# Patient Record
Sex: Female | Born: 1955 | Race: White | Hispanic: No | Marital: Married | Smoking: Never smoker
Health system: Southern US, Community
[De-identification: ages and names within clinical notes are randomized; demographics above are authoritative.]

## PROBLEM LIST (undated history)

## (undated) DIAGNOSIS — E538 Deficiency of other specified B group vitamins: Secondary | ICD-10-CM

## (undated) DIAGNOSIS — K8020 Calculus of gallbladder without cholecystitis without obstruction: Secondary | ICD-10-CM

## (undated) DIAGNOSIS — I10 Essential (primary) hypertension: Secondary | ICD-10-CM

## (undated) DIAGNOSIS — B029 Zoster without complications: Secondary | ICD-10-CM

## (undated) DIAGNOSIS — Z8489 Family history of other specified conditions: Secondary | ICD-10-CM

## (undated) DIAGNOSIS — N6019 Diffuse cystic mastopathy of unspecified breast: Secondary | ICD-10-CM

## (undated) DIAGNOSIS — G4730 Sleep apnea, unspecified: Secondary | ICD-10-CM

## (undated) DIAGNOSIS — E7800 Pure hypercholesterolemia, unspecified: Secondary | ICD-10-CM

## (undated) DIAGNOSIS — E0590 Thyrotoxicosis, unspecified without thyrotoxic crisis or storm: Secondary | ICD-10-CM

## (undated) DIAGNOSIS — K269 Duodenal ulcer, unspecified as acute or chronic, without hemorrhage or perforation: Secondary | ICD-10-CM

## (undated) DIAGNOSIS — C4490 Unspecified malignant neoplasm of skin, unspecified: Secondary | ICD-10-CM

## (undated) DIAGNOSIS — M5030 Other cervical disc degeneration, unspecified cervical region: Secondary | ICD-10-CM

## (undated) DIAGNOSIS — E119 Type 2 diabetes mellitus without complications: Secondary | ICD-10-CM

## (undated) DIAGNOSIS — R112 Nausea with vomiting, unspecified: Secondary | ICD-10-CM

## (undated) DIAGNOSIS — E559 Vitamin D deficiency, unspecified: Secondary | ICD-10-CM

## (undated) DIAGNOSIS — Z9889 Other specified postprocedural states: Secondary | ICD-10-CM

## (undated) DIAGNOSIS — D649 Anemia, unspecified: Secondary | ICD-10-CM

## (undated) DIAGNOSIS — K219 Gastro-esophageal reflux disease without esophagitis: Secondary | ICD-10-CM

## (undated) HISTORY — DX: Unspecified malignant neoplasm of skin, unspecified: C44.90

## (undated) HISTORY — PX: MANDIBLE SURGERY: SHX707

## (undated) HISTORY — PX: ECTOPIC PREGNANCY SURGERY: SHX613

## (undated) HISTORY — PX: ABDOMINAL HYSTERECTOMY: SHX81

## (undated) HISTORY — PX: HAND SURGERY: SHX662

## (undated) HISTORY — PX: WISDOM TOOTH EXTRACTION: SHX21

---

## 1990-09-12 HISTORY — PX: BREAST EXCISIONAL BIOPSY: SUR124

## 2005-02-15 ENCOUNTER — Ambulatory Visit: Payer: Self-pay | Admitting: Unknown Physician Specialty

## 2005-03-12 ENCOUNTER — Ambulatory Visit: Payer: Self-pay | Admitting: Unknown Physician Specialty

## 2005-07-15 ENCOUNTER — Ambulatory Visit: Payer: Self-pay | Admitting: General Surgery

## 2005-07-26 ENCOUNTER — Ambulatory Visit: Payer: Self-pay

## 2005-08-12 ENCOUNTER — Ambulatory Visit: Payer: Self-pay

## 2006-07-26 ENCOUNTER — Ambulatory Visit: Payer: Self-pay | Admitting: General Surgery

## 2006-08-28 ENCOUNTER — Ambulatory Visit: Payer: Self-pay | Admitting: Internal Medicine

## 2006-09-12 ENCOUNTER — Ambulatory Visit: Payer: Self-pay | Admitting: Internal Medicine

## 2006-09-20 ENCOUNTER — Ambulatory Visit: Payer: Self-pay | Admitting: Unknown Physician Specialty

## 2006-09-27 ENCOUNTER — Ambulatory Visit: Payer: Self-pay | Admitting: Internal Medicine

## 2006-10-13 ENCOUNTER — Ambulatory Visit: Payer: Self-pay | Admitting: Internal Medicine

## 2007-10-04 ENCOUNTER — Ambulatory Visit: Payer: Self-pay | Admitting: General Surgery

## 2008-01-21 ENCOUNTER — Ambulatory Visit: Payer: Self-pay | Admitting: Internal Medicine

## 2008-02-11 ENCOUNTER — Ambulatory Visit: Payer: Self-pay | Admitting: Internal Medicine

## 2008-09-10 ENCOUNTER — Ambulatory Visit: Payer: Self-pay | Admitting: Unknown Physician Specialty

## 2008-10-06 ENCOUNTER — Ambulatory Visit: Payer: Self-pay | Admitting: Unknown Physician Specialty

## 2008-11-22 ENCOUNTER — Ambulatory Visit: Payer: Self-pay | Admitting: Unknown Physician Specialty

## 2010-02-02 ENCOUNTER — Ambulatory Visit: Payer: Self-pay | Admitting: Unknown Physician Specialty

## 2011-02-16 ENCOUNTER — Ambulatory Visit: Payer: Self-pay | Admitting: Unknown Physician Specialty

## 2011-03-29 ENCOUNTER — Ambulatory Visit: Payer: Self-pay | Admitting: Unknown Physician Specialty

## 2011-05-04 ENCOUNTER — Ambulatory Visit: Payer: Self-pay | Admitting: Unknown Physician Specialty

## 2011-05-05 LAB — PATHOLOGY REPORT

## 2011-07-13 DIAGNOSIS — IMO0002 Reserved for concepts with insufficient information to code with codable children: Secondary | ICD-10-CM | POA: Insufficient documentation

## 2011-07-13 DIAGNOSIS — E1065 Type 1 diabetes mellitus with hyperglycemia: Secondary | ICD-10-CM | POA: Insufficient documentation

## 2011-07-13 DIAGNOSIS — K219 Gastro-esophageal reflux disease without esophagitis: Secondary | ICD-10-CM | POA: Insufficient documentation

## 2011-07-13 DIAGNOSIS — E7800 Pure hypercholesterolemia, unspecified: Secondary | ICD-10-CM | POA: Insufficient documentation

## 2011-12-22 DIAGNOSIS — Z79899 Other long term (current) drug therapy: Secondary | ICD-10-CM | POA: Insufficient documentation

## 2011-12-22 DIAGNOSIS — Z8639 Personal history of other endocrine, nutritional and metabolic disease: Secondary | ICD-10-CM | POA: Insufficient documentation

## 2012-04-23 DIAGNOSIS — K8020 Calculus of gallbladder without cholecystitis without obstruction: Secondary | ICD-10-CM | POA: Insufficient documentation

## 2012-06-28 DIAGNOSIS — M549 Dorsalgia, unspecified: Secondary | ICD-10-CM | POA: Insufficient documentation

## 2012-08-07 ENCOUNTER — Ambulatory Visit: Payer: Self-pay | Admitting: Unknown Physician Specialty

## 2012-12-06 DIAGNOSIS — G4730 Sleep apnea, unspecified: Secondary | ICD-10-CM | POA: Insufficient documentation

## 2013-02-01 DIAGNOSIS — R0683 Snoring: Secondary | ICD-10-CM | POA: Insufficient documentation

## 2013-02-01 DIAGNOSIS — R519 Headache, unspecified: Secondary | ICD-10-CM | POA: Insufficient documentation

## 2013-08-13 ENCOUNTER — Ambulatory Visit: Payer: Self-pay | Admitting: Unknown Physician Specialty

## 2013-08-23 ENCOUNTER — Ambulatory Visit: Payer: Self-pay | Admitting: Unknown Physician Specialty

## 2013-11-29 ENCOUNTER — Ambulatory Visit: Payer: Self-pay | Admitting: Unknown Physician Specialty

## 2014-11-17 DIAGNOSIS — I10 Essential (primary) hypertension: Secondary | ICD-10-CM | POA: Insufficient documentation

## 2015-04-28 ENCOUNTER — Other Ambulatory Visit: Payer: Self-pay | Admitting: Unknown Physician Specialty

## 2015-04-28 DIAGNOSIS — Z1231 Encounter for screening mammogram for malignant neoplasm of breast: Secondary | ICD-10-CM

## 2015-05-13 ENCOUNTER — Ambulatory Visit: Payer: Self-pay

## 2015-06-02 ENCOUNTER — Ambulatory Visit
Admission: RE | Admit: 2015-06-02 | Discharge: 2015-06-02 | Disposition: A | Discharge status: Home / Self Care | Payer: BLUE CROSS/BLUE SHIELD | Source: Ambulatory Visit | Attending: Unknown Physician Specialty | Admitting: Unknown Physician Specialty

## 2015-06-02 DIAGNOSIS — Z1231 Encounter for screening mammogram for malignant neoplasm of breast: Secondary | ICD-10-CM | POA: Insufficient documentation

## 2015-11-18 DIAGNOSIS — Z0000 Encounter for general adult medical examination without abnormal findings: Secondary | ICD-10-CM | POA: Insufficient documentation

## 2016-06-07 ENCOUNTER — Other Ambulatory Visit: Payer: Self-pay | Admitting: Obstetrics & Gynecology

## 2016-06-07 DIAGNOSIS — Z1231 Encounter for screening mammogram for malignant neoplasm of breast: Secondary | ICD-10-CM

## 2016-06-09 ENCOUNTER — Other Ambulatory Visit: Payer: Self-pay | Admitting: Obstetrics & Gynecology

## 2016-06-09 DIAGNOSIS — N644 Mastodynia: Secondary | ICD-10-CM

## 2016-06-09 DIAGNOSIS — R928 Other abnormal and inconclusive findings on diagnostic imaging of breast: Secondary | ICD-10-CM

## 2016-06-24 ENCOUNTER — Ambulatory Visit
Admission: RE | Admit: 2016-06-24 | Discharge: 2016-06-24 | Disposition: A | Discharge status: Home / Self Care | Payer: BLUE CROSS/BLUE SHIELD | Source: Ambulatory Visit | Attending: Obstetrics & Gynecology | Admitting: Obstetrics & Gynecology

## 2016-06-24 ENCOUNTER — Ambulatory Visit
Admission: RE | Admit: 2016-06-24 | Discharge: 2016-06-24 | Disposition: A | Discharge status: Home / Self Care | Source: Ambulatory Visit | Attending: Obstetrics & Gynecology | Admitting: Obstetrics & Gynecology

## 2016-06-24 DIAGNOSIS — R928 Other abnormal and inconclusive findings on diagnostic imaging of breast: Secondary | ICD-10-CM

## 2016-06-24 DIAGNOSIS — N644 Mastodynia: Secondary | ICD-10-CM

## 2016-07-05 ENCOUNTER — Encounter: Payer: Self-pay | Admitting: *Deleted

## 2016-07-06 ENCOUNTER — Encounter
Admission: RE | Disposition: A | Discharge status: Home / Self Care | Payer: Self-pay | Source: Ambulatory Visit | Attending: Unknown Physician Specialty

## 2016-07-06 ENCOUNTER — Ambulatory Visit
Admission: RE | Admit: 2016-07-06 | Discharge: 2016-07-06 | Disposition: A | Discharge status: Home / Self Care | Payer: BLUE CROSS/BLUE SHIELD | Source: Ambulatory Visit | Attending: Unknown Physician Specialty | Admitting: Unknown Physician Specialty

## 2016-07-06 ENCOUNTER — Ambulatory Visit: Payer: BLUE CROSS/BLUE SHIELD | Admitting: Anesthesiology

## 2016-07-06 ENCOUNTER — Encounter: Payer: Self-pay | Admitting: *Deleted

## 2016-07-06 DIAGNOSIS — Z794 Long term (current) use of insulin: Secondary | ICD-10-CM | POA: Diagnosis not present

## 2016-07-06 DIAGNOSIS — K5730 Diverticulosis of large intestine without perforation or abscess without bleeding: Secondary | ICD-10-CM | POA: Diagnosis not present

## 2016-07-06 DIAGNOSIS — E7800 Pure hypercholesterolemia, unspecified: Secondary | ICD-10-CM | POA: Insufficient documentation

## 2016-07-06 DIAGNOSIS — G4730 Sleep apnea, unspecified: Secondary | ICD-10-CM | POA: Diagnosis not present

## 2016-07-06 DIAGNOSIS — E109 Type 1 diabetes mellitus without complications: Secondary | ICD-10-CM | POA: Insufficient documentation

## 2016-07-06 DIAGNOSIS — Z1211 Encounter for screening for malignant neoplasm of colon: Secondary | ICD-10-CM | POA: Diagnosis present

## 2016-07-06 DIAGNOSIS — E559 Vitamin D deficiency, unspecified: Secondary | ICD-10-CM | POA: Diagnosis not present

## 2016-07-06 DIAGNOSIS — I10 Essential (primary) hypertension: Secondary | ICD-10-CM | POA: Insufficient documentation

## 2016-07-06 DIAGNOSIS — Z86010 Personal history of colonic polyps: Secondary | ICD-10-CM | POA: Insufficient documentation

## 2016-07-06 DIAGNOSIS — D649 Anemia, unspecified: Secondary | ICD-10-CM | POA: Diagnosis not present

## 2016-07-06 DIAGNOSIS — E538 Deficiency of other specified B group vitamins: Secondary | ICD-10-CM | POA: Diagnosis not present

## 2016-07-06 DIAGNOSIS — E0590 Thyrotoxicosis, unspecified without thyrotoxic crisis or storm: Secondary | ICD-10-CM | POA: Diagnosis not present

## 2016-07-06 DIAGNOSIS — K640 First degree hemorrhoids: Secondary | ICD-10-CM | POA: Diagnosis not present

## 2016-07-06 DIAGNOSIS — K219 Gastro-esophageal reflux disease without esophagitis: Secondary | ICD-10-CM | POA: Insufficient documentation

## 2016-07-06 DIAGNOSIS — E669 Obesity, unspecified: Secondary | ICD-10-CM | POA: Diagnosis not present

## 2016-07-06 DIAGNOSIS — Z79899 Other long term (current) drug therapy: Secondary | ICD-10-CM | POA: Diagnosis not present

## 2016-07-06 DIAGNOSIS — Z6832 Body mass index (BMI) 32.0-32.9, adult: Secondary | ICD-10-CM | POA: Diagnosis not present

## 2016-07-06 DIAGNOSIS — K279 Peptic ulcer, site unspecified, unspecified as acute or chronic, without hemorrhage or perforation: Secondary | ICD-10-CM | POA: Diagnosis not present

## 2016-07-06 DIAGNOSIS — Z7982 Long term (current) use of aspirin: Secondary | ICD-10-CM | POA: Insufficient documentation

## 2016-07-06 HISTORY — DX: Pure hypercholesterolemia, unspecified: E78.00

## 2016-07-06 HISTORY — DX: Duodenal ulcer, unspecified as acute or chronic, without hemorrhage or perforation: K26.9

## 2016-07-06 HISTORY — DX: Thyrotoxicosis, unspecified without thyrotoxic crisis or storm: E05.90

## 2016-07-06 HISTORY — PX: COLONOSCOPY WITH PROPOFOL: SHX5780

## 2016-07-06 HISTORY — DX: Essential (primary) hypertension: I10

## 2016-07-06 HISTORY — DX: Diffuse cystic mastopathy of unspecified breast: N60.19

## 2016-07-06 HISTORY — DX: Type 2 diabetes mellitus without complications: E11.9

## 2016-07-06 HISTORY — DX: Sleep apnea, unspecified: G47.30

## 2016-07-06 HISTORY — DX: Calculus of gallbladder without cholecystitis without obstruction: K80.20

## 2016-07-06 HISTORY — DX: Vitamin D deficiency, unspecified: E55.9

## 2016-07-06 HISTORY — DX: Zoster without complications: B02.9

## 2016-07-06 HISTORY — DX: Other cervical disc degeneration, unspecified cervical region: M50.30

## 2016-07-06 HISTORY — DX: Deficiency of other specified B group vitamins: E53.8

## 2016-07-06 HISTORY — DX: Gastro-esophageal reflux disease without esophagitis: K21.9

## 2016-07-06 HISTORY — DX: Anemia, unspecified: D64.9

## 2016-07-06 LAB — GLUCOSE, CAPILLARY: Glucose-Capillary: 241 mg/dL — ABNORMAL HIGH (ref 65–99)

## 2016-07-06 SURGERY — COLONOSCOPY WITH PROPOFOL
Anesthesia: General

## 2016-07-06 MED ORDER — LIDOCAINE 2% (20 MG/ML) 5 ML SYRINGE
INTRAMUSCULAR | Status: DC | PRN
  Administered 2016-07-06: 30 mg via INTRAVENOUS

## 2016-07-06 MED ORDER — FENTANYL CITRATE (PF) 100 MCG/2ML IJ SOLN
INTRAMUSCULAR | Status: DC | PRN
  Administered 2016-07-06: 50 ug via INTRAVENOUS

## 2016-07-06 MED ORDER — SODIUM CHLORIDE 0.9 % IV SOLN
INTRAVENOUS | Status: DC
  Administered 2016-07-06: 1000 mL via INTRAVENOUS

## 2016-07-06 MED ORDER — PHENYLEPHRINE HCL 10 MG/ML IJ SOLN
INTRAMUSCULAR | Status: DC | PRN
  Administered 2016-07-06 (×2): 100 ug via INTRAVENOUS

## 2016-07-06 MED ORDER — PROPOFOL 500 MG/50ML IV EMUL
INTRAVENOUS | Status: DC | PRN
  Administered 2016-07-06: 150 ug/kg/min via INTRAVENOUS

## 2016-07-06 MED ORDER — SODIUM CHLORIDE 0.9 % IV SOLN: INTRAVENOUS | Status: DC

## 2016-07-06 MED ORDER — MIDAZOLAM HCL 5 MG/5ML IJ SOLN
INTRAMUSCULAR | Status: DC | PRN
  Administered 2016-07-06: 1 mg via INTRAVENOUS

## 2016-07-06 MED ORDER — PROPOFOL 10 MG/ML IV BOLUS
INTRAVENOUS | Status: DC | PRN
  Administered 2016-07-06: 80 mg via INTRAVENOUS

## 2016-07-06 NOTE — Anesthesia Postprocedure Evaluation (Signed)
Anesthesia Post Note  Patient: Terry Hickman  Procedure(s) Performed: Procedure(s) (LRB): COLONOSCOPY WITH PROPOFOL (N/A)  Patient location during evaluation: PACU Anesthesia Type: General Level of consciousness: awake Pain management: pain level controlled Vital Signs Assessment: post-procedure vital signs reviewed and stable Respiratory status: nonlabored ventilation Cardiovascular status: stable Anesthetic complications: no    Last Vitals:  Vitals:   07/06/16 0802 07/06/16 0812  BP: (!) 89/48 93/65  Pulse: 70 66  Resp: 19 20  Temp: 36.2 C     Last Pain:  Vitals:   07/06/16 0802  TempSrc: Tympanic                 VAN STAVEREN,Cohan Stipes

## 2016-07-06 NOTE — Transfer of Care (Signed)
Immediate Anesthesia Transfer of Care Note  Patient: Terry Hickman  Procedure(s) Performed: Procedure(s): COLONOSCOPY WITH PROPOFOL (N/A)  Patient Location: PACU and Endoscopy Unit  Anesthesia Type:General  Level of Consciousness: sedated  Airway & Oxygen Therapy: Patient Spontanous Breathing and Patient connected to nasal cannula oxygen  Post-op Assessment: Report given to RN and Post -op Vital signs reviewed and stable  Post vital signs: Reviewed and stable  Last Vitals:  Vitals:   07/06/16 0658 07/06/16 0700  BP: 124/78 (!) 89/48  Pulse: 74 70  Resp: 20 20  Temp: 36.6 C 36.2 C    Last Pain:  Vitals:   07/06/16 0700  TempSrc: Tympanic         Complications: No apparent anesthesia complications

## 2016-07-06 NOTE — H&P (Signed)
Primary Care Physician:  Kirk Ruths., MD Primary Gastroenterologist:  Dr. Vira Agar  Pre-Procedure History & Physical: HPI:  Terry Hickman is a 60 y.o. female is here for an colonoscopy.   Past Medical History:  Diagnosis Date  . Anemia   . DDD (degenerative disc disease), cervical   . Diabetes mellitus without complication ([place])   . Duodenal ulcer   . Fibrocystic breast disease   . Gall stone   . GERD (gastroesophageal reflux disease)   . Herpes zoster   . Hypercholesterolemia   . Hypertension   . Hyperthyroidism   . Shingles   . Sleep apnea   . Vitamin B 12 deficiency   . Vitamin D deficiency     Past Surgical History:  Procedure Laterality Date  . BREAST BIOPSY Right 09/12/1990   negative  . ECTOPIC PREGNANCY SURGERY    . MANDIBLE SURGERY      Prior to Admission medications   Medication Sig Start Date End Date Taking? Authorizing Provider  ascorbic acid (VITAMIN C) 1000 MG tablet Take 1,000 mg by mouth daily.   Yes Historical Provider, MD  aspirin EC 81 MG tablet Take 81 mg by mouth daily.   Yes Historical Provider, MD  atorvastatin (LIPITOR) 40 MG tablet Take 40 mg by mouth daily.   Yes Historical Provider, MD  cyanocobalamin 1000 MCG tablet Take 1,000 mcg by mouth daily.   Yes Historical Provider, MD  ergocalciferol (VITAMIN D2) 50000 units capsule Take 50,000 Units by mouth once a week.   Yes Historical Provider, MD  hydrochlorothiazide (HYDRODIURIL) 25 MG tablet Take 25 mg by mouth daily.   Yes Historical Provider, MD  insulin aspart (NOVOLOG FLEXPEN) 100 UNIT/ML FlexPen Inject 12 Units into the skin.   Yes Historical Provider, MD  insulin glargine (LANTUS) 100 UNIT/ML injection Inject 100 Units into the skin at bedtime.   Yes Historical Provider, MD  Melatonin 3 MG TABS Take 3 mg by mouth at bedtime.   Yes Historical Provider, MD  omeprazole (PRILOSEC) 20 MG capsule Take 20 mg by mouth 2 (two) times daily before a meal.   Yes Historical Provider, MD   ondansetron (ZOFRAN-ODT) 4 MG disintegrating tablet Take 4 mg by mouth every 8 (eight) hours as needed for nausea or vomiting.   Yes Historical Provider, MD  potassium chloride (MICRO-K) 10 MEQ CR capsule Take 10 mEq by mouth daily.   Yes Historical Provider, MD  valsartan (DIOVAN) 160 MG tablet Take 160 mg by mouth daily.   Yes Historical Provider, MD    Allergies as of 06/21/2016  . (Not on File)    Family History  Problem Relation Age of Onset  . Bladder Cancer Father   . Prostate cancer Father   . Bone cancer Maternal Aunt 80    Social History   Social History  . Marital status: Married    Spouse name: N/A  . Number of children: N/A  . Years of education: N/A   Occupational History  . Not on file.   Social History Main Topics  . Smoking status: Never Smoker  . Smokeless tobacco: Never Used  . Alcohol use No  . Drug use: No  . Sexual activity: Not on file   Other Topics Concern  . Not on file   Social History Narrative  . No narrative on file    Review of Systems: See HPI, otherwise negative ROS  Physical Exam: BP 124/78   Pulse 74   Temp 97.9 F (36.6  C) (Tympanic)   Resp 20   Ht 5\' 8"  (1.727 m)   Wt 96.6 kg (213 lb)   SpO2 99%   BMI 32.39 kg/m  General:   Alert,  pleasant and cooperative in NAD Head:  Normocephalic and atraumatic. Neck:  Supple; no masses or thyromegaly. Lungs:  Clear throughout to auscultation.    Heart:  Regular rate and rhythm. Abdomen:  Soft, nontender and nondistended. Normal bowel sounds, without guarding, and without rebound.   Neurologic:  Alert and  oriented x4;  grossly normal neurologically.  Impression/Plan: Terry Hickman is here for an colonoscopy to be performed for [facility] colon polyps  Risks, benefits, limitations, and alternatives regarding  colonoscopy have been reviewed with the patient.  Questions have been answered.  All parties agreeable.   Gaylyn Cheers, MD  07/06/2016, 7:31 AM

## 2016-07-06 NOTE — Anesthesia Preprocedure Evaluation (Addendum)
Anesthesia Evaluation  Patient identified by MRN, date of birth, ID band Patient awake    Reviewed: Allergy & Precautions, NPO status , Patient's Chart, lab work & pertinent test results  Airway Mallampati: II       Dental  (+) Teeth Intact, Caps   Pulmonary sleep apnea and Continuous Positive Airway Pressure Ventilation ,    breath sounds clear to auscultation       Cardiovascular hypertension, Pt. on medications  Rhythm:Regular     Neuro/Psych    GI/Hepatic PUD, GERD  Medicated,  Endo/Other  diabetes, Type 1, Insulin DependentHyperthyroidism   Renal/GU      Musculoskeletal   Abdominal (+) + obese,   Peds  Hematology  (+) anemia ,   Anesthesia Other Findings   Reproductive/Obstetrics                            Anesthesia Physical Anesthesia Plan  ASA: II  Anesthesia Plan: General   Post-op Pain Management:    Induction: Intravenous  Airway Management Planned: Natural Airway and Nasal Cannula  Additional Equipment:   Intra-op Plan:   Post-operative Plan:   Informed Consent: I have reviewed the patients History and Physical, chart, labs and discussed the procedure including the risks, benefits and alternatives for the proposed anesthesia with the patient or authorized representative who has indicated his/her understanding and acceptance.     Plan Discussed with: CRNA  Anesthesia Plan Comments:         Anesthesia Quick Evaluation

## 2016-07-06 NOTE — Op Note (Signed)
[facility] Gastroenterology Patient Name: Terry Hickman Procedure Date: 07/06/2016 7:33 AM MRN: [id number] Account #: [id number] Date of Birth: 03-Aug-1956 Admit Type: Outpatient Age: 60 Room: [facility] ENDO ROOM 4 Gender: Female Note Status: Finalized Procedure:            Colonoscopy Indications:          Screening for colorectal malignant neoplasm Providers:            Manya Silvas, MD Referring MD:         Ocie Cornfield. Ouida Sills MD, MD (Referring MD) Medicines:            Propofol per Anesthesia Complications:        No immediate complications. Procedure:            Pre-Anesthesia Assessment:                       - After reviewing the risks and benefits, the patient                        was deemed in satisfactory condition to undergo the                        procedure.                       After obtaining informed consent, the colonoscope was                        passed under direct vision. Throughout the procedure,                        the patient's blood pressure, pulse, and oxygen                        saturations were monitored continuously. The                        Colonoscope was introduced through the anus and                        advanced to the the cecum, identified by appendiceal                        orifice and ileocecal valve. The colonoscopy was                        performed without difficulty. The patient tolerated the                        procedure well. The quality of the bowel preparation                        was excellent. Findings:      A few small-mouthed diverticula were found in the sigmoid colon,       descending colon and transverse colon.      Internal hemorrhoids were found during endoscopy. The hemorrhoids were       small and Grade I (internal hemorrhoids that do not prolapse).      The exam was otherwise without abnormality. Impression:           - Diverticulosis  in the sigmoid colon, in the           descending colon and in the transverse colon.                       - Internal hemorrhoids.                       - The examination was otherwise normal.                       - No specimens collected. Recommendation:       - Repeat colonoscopy in 5 years for surveillance. Manya Silvas, MD 07/06/2016 7:55:57 AM This report has been signed electronically. Number of Addenda: 0 Note Initiated On: 07/06/2016 7:33 AM Scope Withdrawal Time: 0 hours 8 minutes 58 seconds  Total Procedure Duration: 0 hours 15 minutes 3 seconds       Oper[facility]

## 2016-12-14 ENCOUNTER — Encounter: Payer: Self-pay | Admitting: Obstetrics & Gynecology

## 2016-12-19 ENCOUNTER — Ambulatory Visit (INDEPENDENT_AMBULATORY_CARE_PROVIDER_SITE_OTHER): Payer: BLUE CROSS/BLUE SHIELD | Admitting: Obstetrics & Gynecology

## 2016-12-19 ENCOUNTER — Encounter: Payer: Self-pay | Admitting: Obstetrics & Gynecology

## 2016-12-19 VITALS — BP 130/80 | HR 86 | Ht 68.0 in | Wt 238.0 lb

## 2016-12-19 DIAGNOSIS — N644 Mastodynia: Secondary | ICD-10-CM | POA: Diagnosis not present

## 2016-12-19 DIAGNOSIS — Z0000 Encounter for general adult medical examination without abnormal findings: Secondary | ICD-10-CM | POA: Diagnosis not present

## 2016-12-19 DIAGNOSIS — Z13820 Encounter for screening for osteoporosis: Secondary | ICD-10-CM

## 2016-12-19 DIAGNOSIS — B373 Candidiasis of vulva and vagina: Secondary | ICD-10-CM | POA: Diagnosis not present

## 2016-12-19 DIAGNOSIS — B3731 Acute candidiasis of vulva and vagina: Secondary | ICD-10-CM

## 2016-12-19 MED ORDER — NYSTATIN 100000 UNIT/GM EX CREA: 1.0000 "application " | TOPICAL_CREAM | Freq: Two times a day (BID) | CUTANEOUS | 6 refills | Status: DC

## 2016-12-19 NOTE — Progress Notes (Signed)
HPI:      [name] is a 61 y.o. G1P0010 who LMP was in the past, she presents today for her annual examination.  The patient has no complaints today. The patient is not sexually active. 2017 last pap: was normal. The patient is not taking hormone replacement therapy. Patient denies post-menopausal vaginal bleeding.   The patient has regular exercise: no. Right breast/nipple T mild, x 2 years, last MMG and prior US here normal.  Occas vulvar itching relieved by Nystatin PRN.  GYN Hx: Last Colonoscopy:1 year ago. Normal.  Last DEXA: 5 years ago.  Normal.  PMHx: She  has a past medical history of Anemia; DDD (degenerative disc disease), cervical; Diabetes mellitus without complication ([place]); Duodenal ulcer; Fibrocystic breast disease; Gall stone; GERD (gastroesophageal reflux disease); Herpes zoster; Hypercholesterolemia; Hypertension; Hyperthyroidism; Shingles; Sleep apnea; Vitamin B 12 deficiency; and Vitamin D deficiency. Also,  has a past surgical history that includes Breast biopsy (Right, 09/12/1990); Mandible surgery; Ectopic pregnancy surgery; Colonoscopy with propofol (N/A, 07/06/2016); Wisdom tooth extraction; and Abdominal hysterectomy., family history includes Bladder Cancer in her father; Bone cancer (age of onset: 34) in her maternal aunt; Cancer in her maternal aunt and maternal grandmother; Diabetes in her mother; Hyperthyroidism in her mother; Prostate cancer in her father.,  reports that she has never smoked. She has never used smokeless tobacco. She reports that she does not drink alcohol or use drugs.  She has a current medication list which includes the following prescription(s): amoxicillin, vitamin c, aspirin, atorvastatin, cyanocobalamin, ergocalciferol, furosemide, glucose blood, hydrochlorothiazide, insulin aspart, insulin glargine, insulin pen needle, lantus solostar, melatonin, omeprazole, ondansetron, potassium chloride, valsartan, vitamin b-12, vitamin d  (ergocalciferol), and nystatin cream. Also, is allergic to codeine.  Review of Systems  Constitutional: Negative for chills, fever and malaise/fatigue.  HENT: Negative for congestion, sinus pain and sore throat.   Eyes: Negative for blurred vision and pain.  Respiratory: Negative for cough and wheezing.   Cardiovascular: Negative for chest pain and leg swelling.  Gastrointestinal: Negative for abdominal pain, constipation, diarrhea, heartburn, nausea and vomiting.  Genitourinary: Negative for dysuria, frequency, hematuria and urgency.  Musculoskeletal: Negative for back pain, joint pain, myalgias and neck pain.  Skin: Negative for itching and rash.  Neurological: Negative for dizziness, tremors and weakness.  Endo/Heme/Allergies: Does not bruise/bleed easily.  Psychiatric/Behavioral: Negative for depression. The patient is not nervous/anxious and does not have insomnia.     Objective: BP 130/80   Pulse 86   Ht 5\' 8"  (1.727 m)   Wt 238 lb (108 kg)   BMI 36.19 kg/m  Physical Exam  Constitutional: She is oriented to person, place, and time. She appears well-developed and well-nourished. No distress.  Genitourinary: Rectum normal, vagina normal and uterus normal. Pelvic exam was performed with patient supine. There is no rash or lesion on the right labia. There is no rash or lesion on the left labia. Vagina exhibits no lesion. No bleeding in the vagina. Right adnexum does not display mass and does not display tenderness. Left adnexum does not display mass and does not display tenderness. Cervix does not exhibit motion tenderness, lesion, friability or polyp.   Uterus is mobile and midaxial. Uterus is not enlarged or exhibiting a mass.  HENT:  Head: Normocephalic and atraumatic. Head is without laceration.  Right Ear: Hearing normal.  Left Ear: Hearing normal.  Nose: No epistaxis.  No foreign bodies.  Mouth/Throat: Uvula is midline, oropharynx is clear and moist and mucous membranes are  normal.  Eyes: Pupils are equal, round, and reactive to light.  Neck: Normal range of motion. Neck supple. No thyromegaly present.  Cardiovascular: Normal rate and regular rhythm.  Exam reveals no gallop and no friction rub.   No murmur heard. Pulmonary/Chest: Effort normal and breath sounds normal. No respiratory distress. She has no wheezes. Right breast exhibits no mass, no skin change and no tenderness. Left breast exhibits no mass, no skin change and no tenderness.  Abdominal: Soft. Bowel sounds are normal. She exhibits no distension. There is no tenderness. There is no rebound.  Musculoskeletal: Normal range of motion.  Neurological: She is alert and oriented to person, place, and time. No cranial nerve deficit.  Skin: Skin is warm and dry.  Psychiatric: She has a normal mood and affect. Judgment normal.  Vitals reviewed.   Assessment: Annual Exam 1. Annual physical exam   2. Breast tenderness in female   3. Screening for osteoporosis   4. Candidal vulvitis    Plan:            1.  Cervical Screening-  Pap smear schedule reviewed with patient- q 5 years  2. Breast screening- Exam annually and mammogram scheduled  3. Colonoscopy every 10 years, Hemoccult testing after age 43; Hemoccult next visit  4. Labs per PCP   5. Counseling for hormonal therapy: none  6. Nystatin as needed  7. DEXA as is due.  Calcium. Last DEXA has -1.4 t score c/w osteopenia.     F/U  Return in about 1 year (around 12/19/2017) for Annual.  Barnett Applebaum, MD, [facility], [facility] 12/19/2016  3:33 PM

## 2016-12-20 ENCOUNTER — Telehealth: Payer: Self-pay | Admitting: Obstetrics & Gynecology

## 2016-12-20 NOTE — Telephone Encounter (Signed)
Patient is aware of DEXA appointment on Tues, 01/24/17 @ 9:00am.

## 2017-01-24 ENCOUNTER — Encounter: Payer: Self-pay | Admitting: Obstetrics & Gynecology

## 2017-01-24 ENCOUNTER — Ambulatory Visit
Admission: RE | Admit: 2017-01-24 | Discharge: 2017-01-24 | Disposition: A | Discharge status: Home / Self Care | Payer: BLUE CROSS/BLUE SHIELD | Source: Ambulatory Visit | Attending: Obstetrics & Gynecology | Admitting: Obstetrics & Gynecology

## 2017-01-24 DIAGNOSIS — M8580 Other specified disorders of bone density and structure, unspecified site: Secondary | ICD-10-CM | POA: Diagnosis not present

## 2017-01-24 DIAGNOSIS — Z13820 Encounter for screening for osteoporosis: Secondary | ICD-10-CM | POA: Insufficient documentation

## 2017-08-10 ENCOUNTER — Ambulatory Visit
Admission: RE | Admit: 2017-08-10 | Discharge: 2017-08-10 | Disposition: A | Discharge status: Home / Self Care | Payer: BLUE CROSS/BLUE SHIELD | Source: Ambulatory Visit | Attending: Obstetrics & Gynecology | Admitting: Obstetrics & Gynecology

## 2017-08-10 DIAGNOSIS — Z1231 Encounter for screening mammogram for malignant neoplasm of breast: Secondary | ICD-10-CM | POA: Insufficient documentation

## 2017-08-10 DIAGNOSIS — Z0000 Encounter for general adult medical examination without abnormal findings: Secondary | ICD-10-CM | POA: Diagnosis not present

## 2017-08-11 ENCOUNTER — Encounter: Payer: Self-pay | Admitting: Obstetrics & Gynecology

## 2017-11-13 ENCOUNTER — Ambulatory Visit: Admitting: Dietician

## 2017-11-14 ENCOUNTER — Other Ambulatory Visit: Payer: Self-pay | Admitting: Obstetrics & Gynecology

## 2017-11-14 ENCOUNTER — Telehealth: Payer: Self-pay

## 2017-11-14 DIAGNOSIS — B373 Candidiasis of vulva and vagina: Secondary | ICD-10-CM

## 2017-11-14 DIAGNOSIS — B3731 Acute candidiasis of vulva and vagina: Secondary | ICD-10-CM

## 2017-11-14 MED ORDER — NYSTATIN 100000 UNIT/GM EX CREA: 1.0000 "application " | TOPICAL_CREAM | Freq: Two times a day (BID) | CUTANEOUS | 6 refills | Status: DC

## 2017-11-14 MED ORDER — TRIAMCINOLONE ACETONIDE 0.1 % EX CREA: 1.0000 "application " | TOPICAL_CREAM | Freq: Two times a day (BID) | CUTANEOUS | 1 refills | Status: DC | PRN

## 2017-11-14 NOTE — Telephone Encounter (Signed)
ERx done this time

## 2017-11-14 NOTE — Telephone Encounter (Signed)
No.  Ok to refill.  (done). Annual in April.

## 2017-11-14 NOTE — Telephone Encounter (Signed)
Left message to let pt know rx been sent in

## 2017-11-14 NOTE — Telephone Encounter (Signed)
Pt requesting refill on nystatin cream. [facility] pt.

## 2017-11-14 NOTE — Telephone Encounter (Signed)
Do you need to see her?

## 2017-11-14 NOTE — Telephone Encounter (Signed)
Pt states she needs the Triamcinolone cream VandDalen had her on?

## 2017-11-29 DIAGNOSIS — I890 Lymphedema, not elsewhere classified: Secondary | ICD-10-CM | POA: Insufficient documentation

## 2018-08-13 ENCOUNTER — Telehealth: Payer: Self-pay

## 2018-08-13 NOTE — Telephone Encounter (Signed)
Pt has apt w/[facility] for AE 09/21/18. She would like to go ahead and schedule her Mammogram. Requesting [facility] to order mammogram. Cb#[phone]

## 2018-08-14 ENCOUNTER — Other Ambulatory Visit: Payer: Self-pay | Admitting: Obstetrics & Gynecology

## 2018-08-14 DIAGNOSIS — Z1239 Encounter for other screening for malignant neoplasm of breast: Secondary | ICD-10-CM

## 2018-08-14 NOTE — Telephone Encounter (Signed)
Order in.

## 2018-08-16 NOTE — Telephone Encounter (Signed)
Patient calling to follow up on mammogram order. Patient advised order is in

## 2018-08-20 ENCOUNTER — Ambulatory Visit
Admission: RE | Admit: 2018-08-20 | Discharge: 2018-08-20 | Disposition: A | Discharge status: Home / Self Care | Payer: BLUE CROSS/BLUE SHIELD | Source: Ambulatory Visit | Attending: Obstetrics & Gynecology | Admitting: Obstetrics & Gynecology

## 2018-08-20 ENCOUNTER — Other Ambulatory Visit: Payer: Self-pay | Admitting: Obstetrics & Gynecology

## 2018-08-20 DIAGNOSIS — N6310 Unspecified lump in the right breast, unspecified quadrant: Secondary | ICD-10-CM

## 2018-08-20 DIAGNOSIS — Z1239 Encounter for other screening for malignant neoplasm of breast: Secondary | ICD-10-CM | POA: Insufficient documentation

## 2018-08-20 DIAGNOSIS — R928 Other abnormal and inconclusive findings on diagnostic imaging of breast: Secondary | ICD-10-CM

## 2018-08-27 NOTE — Progress Notes (Signed)
Pt has annual scheduled in Jan and will follow up on MMG and re-images then

## 2018-09-03 ENCOUNTER — Ambulatory Visit
Admission: RE | Admit: 2018-09-03 | Discharge: 2018-09-03 | Disposition: A | Discharge status: Home / Self Care | Payer: BLUE CROSS/BLUE SHIELD | Source: Ambulatory Visit | Attending: Obstetrics & Gynecology | Admitting: Obstetrics & Gynecology

## 2018-09-03 DIAGNOSIS — R928 Other abnormal and inconclusive findings on diagnostic imaging of breast: Secondary | ICD-10-CM

## 2018-09-03 DIAGNOSIS — N6310 Unspecified lump in the right breast, unspecified quadrant: Secondary | ICD-10-CM | POA: Insufficient documentation

## 2018-09-06 ENCOUNTER — Other Ambulatory Visit: Payer: Self-pay | Admitting: Obstetrics & Gynecology

## 2018-09-06 DIAGNOSIS — R928 Other abnormal and inconclusive findings on diagnostic imaging of breast: Secondary | ICD-10-CM

## 2018-09-06 DIAGNOSIS — N6310 Unspecified lump in the right breast, unspecified quadrant: Secondary | ICD-10-CM

## 2018-09-13 ENCOUNTER — Ambulatory Visit
Admission: RE | Admit: 2018-09-13 | Discharge: 2018-09-13 | Disposition: A | Discharge status: Home / Self Care | Payer: BLUE CROSS/BLUE SHIELD | Source: Ambulatory Visit | Attending: Obstetrics & Gynecology | Admitting: Obstetrics & Gynecology

## 2018-09-13 ENCOUNTER — Other Ambulatory Visit: Payer: Self-pay | Admitting: Obstetrics & Gynecology

## 2018-09-13 DIAGNOSIS — R928 Other abnormal and inconclusive findings on diagnostic imaging of breast: Secondary | ICD-10-CM

## 2018-09-13 DIAGNOSIS — N6310 Unspecified lump in the right breast, unspecified quadrant: Secondary | ICD-10-CM | POA: Diagnosis present

## 2018-09-13 HISTORY — PX: BREAST BIOPSY: SHX20

## 2018-09-14 LAB — SURGICAL PATHOLOGY

## 2018-09-17 ENCOUNTER — Other Ambulatory Visit: Payer: Self-pay | Admitting: Obstetrics & Gynecology

## 2018-09-17 DIAGNOSIS — R928 Other abnormal and inconclusive findings on diagnostic imaging of breast: Secondary | ICD-10-CM

## 2018-09-17 DIAGNOSIS — N6310 Unspecified lump in the right breast, unspecified quadrant: Secondary | ICD-10-CM

## 2018-09-19 ENCOUNTER — Other Ambulatory Visit: Payer: Self-pay | Admitting: Obstetrics & Gynecology

## 2018-09-20 ENCOUNTER — Telehealth: Payer: Self-pay

## 2018-09-20 NOTE — Telephone Encounter (Signed)
I spoke to Rem[facility]t, who was holding for the orders to be signed. Dr Kenton Kingfisher signed and I contacted Aldona Bar to let her know. She will call the patient if not this afternoon, first thing in the morning.

## 2018-09-20 NOTE — Telephone Encounter (Signed)
Pt calling to f/u on request for order for breast bx at [facility].  Pt spoke c Aldona Bar who is waiting on order/paperwork from [facility].  [phone]

## 2018-09-21 ENCOUNTER — Ambulatory Visit: Payer: BLUE CROSS/BLUE SHIELD | Admitting: Obstetrics & Gynecology

## 2018-09-21 NOTE — Telephone Encounter (Signed)
Patient is scheduled   

## 2018-09-27 ENCOUNTER — Other Ambulatory Visit: Payer: Self-pay | Admitting: Diagnostic Radiology

## 2018-09-27 ENCOUNTER — Ambulatory Visit
Admission: RE | Admit: 2018-09-27 | Discharge: 2018-09-27 | Disposition: A | Discharge status: Home / Self Care | Payer: BLUE CROSS/BLUE SHIELD | Source: Ambulatory Visit | Attending: Obstetrics & Gynecology | Admitting: Obstetrics & Gynecology

## 2018-09-27 DIAGNOSIS — R928 Other abnormal and inconclusive findings on diagnostic imaging of breast: Secondary | ICD-10-CM | POA: Insufficient documentation

## 2018-09-27 DIAGNOSIS — N6310 Unspecified lump in the right breast, unspecified quadrant: Secondary | ICD-10-CM

## 2018-09-27 HISTORY — PX: BREAST BIOPSY: SHX20

## 2018-09-28 LAB — SURGICAL PATHOLOGY

## 2018-10-08 ENCOUNTER — Encounter: Payer: Self-pay | Admitting: *Deleted

## 2018-10-08 NOTE — Progress Notes (Signed)
  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  States she wants to do her research and will call me back if she has not heard from Dr. Doreene Adas office with a referral.  Patient called back today and states she would like to see Dr. Bary Castilla for surgical consult.  I have scheduled her to see Dr. Bary Castilla on 10/18/18 @ 2:15.

## 2018-10-09 ENCOUNTER — Ambulatory Visit: Payer: BLUE CROSS/BLUE SHIELD | Admitting: Obstetrics & Gynecology

## 2018-10-18 ENCOUNTER — Encounter: Payer: Self-pay | Admitting: General Surgery

## 2018-10-18 ENCOUNTER — Ambulatory Visit (INDEPENDENT_AMBULATORY_CARE_PROVIDER_SITE_OTHER): Payer: BLUE CROSS/BLUE SHIELD | Admitting: General Surgery

## 2018-10-18 ENCOUNTER — Other Ambulatory Visit: Payer: Self-pay

## 2018-10-18 VITALS — BP 140/75 | HR 94 | Temp 97.9°F | Resp 18 | Ht 68.0 in | Wt 249.4 lb

## 2018-10-18 DIAGNOSIS — E559 Vitamin D deficiency, unspecified: Secondary | ICD-10-CM | POA: Insufficient documentation

## 2018-10-18 DIAGNOSIS — D241 Benign neoplasm of right breast: Secondary | ICD-10-CM | POA: Diagnosis not present

## 2018-10-18 NOTE — Patient Instructions (Addendum)
  Please call our office and let us know what you decide to move forward with. Surgery  versus  6 month Mammogram.

## 2018-10-18 NOTE — Progress Notes (Signed)
Patient ID: Terry Hickman, female   DOB: 11/12/1955, 63 y.o.   MRN: [id number]  Chief Complaint  Patient presents with  . New Patient (Initial Visit)    Right breast papillloma    HPI Terry Hickman is a 63 y.o. female.  Here today to discuss abnormal mammogram / biopsy 09/27/2018. Patient states she feels something that she has not felt before, after the biopsy was done. Denies nipple  drainage, skin changes.   The patient reports that several years ago she had a breast ultrasound for a lesion under the nipple.  She described a prolonged procedure, and while there was no pain during the procedure since that time she is had marked sensitivity of the nipple. HPI  Past Medical History:  Diagnosis Date  . Anemia   . DDD (degenerative disc disease), cervical   . Diabetes mellitus without complication (HCC)   . Duodenal ulcer   . Fibrocystic breast disease   . Gall stone   . GERD (gastroesophageal reflux disease)   . Herpes zoster   . Hypercholesterolemia   . Hypertension   . Hyperthyroidism   . Shingles   . Sleep apnea   . Vitamin B 12 deficiency   . Vitamin D deficiency     Past Surgical History:  Procedure Laterality Date  . ABDOMINAL HYSTERECTOMY    . BREAST BIOPSY Right 09/13/2018   Papilloma, [place] bx  . BREAST BIOPSY Right 09/13/2018   Papilloma, [place] bx  . BREAST BIOPSY Right 09/27/2018   affirm bx of mass, path pending x marker  . BREAST EXCISIONAL BIOPSY Right 09/12/1990   negative  . COLONOSCOPY WITH PROPOFOL N/A 07/06/2016   Procedure: COLONOSCOPY WITH PROPOFOL;  Surgeon: Scot Jun, MD;  Location: [facility] ENDOSCOPY;  Service: Endoscopy;  Laterality: N/A;  . ECTOPIC PREGNANCY SURGERY    . MANDIBLE SURGERY    . WISDOM TOOTH EXTRACTION      Family History  Problem Relation Age of Onset  . Bladder Cancer Father   . Prostate cancer Father   . Bone cancer Maternal Aunt 80  . Cancer Maternal Aunt   . Diabetes Mother   . Hyperthyroidism Mother   . Cancer  Maternal Grandmother   . Breast cancer Neg Hx     Social History Social History   Tobacco Use  . Smoking status: Never Smoker  . Smokeless tobacco: Never Used  Substance Use Topics  . Alcohol use: No  . Drug use: No    Allergies  Allergen Reactions  . Codeine Nausea Only    Current Outpatient Medications  Medication Sig Dispense Refill  . Ascorbic Acid (VITAMIN C) 1000 MG tablet Take 1,000 mg by mouth.    . ASPIRIN 81 PO Take by mouth.    Marland Kitchen atorvastatin (LIPITOR) 40 MG tablet Take 40 mg by mouth daily.    . cyanocobalamin 1000 MCG tablet Take 1,000 mcg by mouth daily.    . ergocalciferol (VITAMIN D2) 50000 units capsule Take 50,000 Units by mouth once a week.    . furosemide (LASIX) 20 MG tablet TAKE 1 TABLET (20 MG TOTAL) BY MOUTH ONCE DAILY.    Marland Kitchen glucose blood (ONE TOUCH ULTRA TEST) test strip Use 4 (four) times daily. Use as instructed.    . hydrochlorothiazide (HYDRODIURIL) 25 MG tablet Take 25 mg by mouth daily.    . Insulin Pen Needle (FIFTY50 PEN NEEDLES) 31G X 5 MM MISC USE WITH INSULIN    . LANTUS SOLOSTAR 100 UNIT/ML Solostar  Pen     . Melatonin 3 MG TABS Take 3 mg by mouth.    . nystatin cream (MYCOSTATIN) Apply 1 application topically 2 (two) times daily. PRN 30 g 6  . omeprazole (PRILOSEC) 20 MG capsule Take 20 mg by mouth 2 (two) times daily before a meal.    . valsartan (DIOVAN) 160 MG tablet Take 160 mg by mouth daily.    . vitamin B-12 (CYANOCOBALAMIN) 1000 MCG tablet Take by mouth.    . Vitamin D, Ergocalciferol, (DRISDOL) 50000 units CAPS capsule TAKE 1 CAPSULE (50,000 UNITS TOTAL) BY MOUTH ONCE A WEEK. **INS ONLY CVRS 30 DAY SUPPLY**     No current facility-administered medications for this visit.     Review of Systems Review of Systems  Constitutional: Negative.   Respiratory: Negative.   Cardiovascular: Negative.     Blood pressure 140/75, pulse 94, temperature 97.9 F (36.6 C), temperature source Temporal, resp. rate 18, height 5\' 8"  (1.727 m),  weight 249 lb 6.4 oz (113.1 kg), SpO2 97 %.  Physical Exam Physical Exam Constitutional:      Appearance: She is well-developed.  Eyes:     General: No scleral icterus.    Conjunctiva/sclera: Conjunctivae normal.  Neck:     Musculoskeletal: Normal range of motion.  Cardiovascular:     Rate and Rhythm: Normal rate and regular rhythm.     Heart sounds: S1 normal and S2 normal. Murmur present. Systolic murmur present with a grade of 2/6.     Comments: The patient denies dyspnea with daily activities. Pulmonary:     Effort: Pulmonary effort is normal.     Breath sounds: Normal breath sounds.  Chest:     Breasts:        Right: No inverted nipple, mass, nipple discharge, skin change or tenderness.        Left: No inverted nipple, mass, nipple discharge, skin change or tenderness.    Lymphadenopathy:     Cervical: No cervical adenopathy.     Upper Body:     Right upper body: No supraclavicular or axillary adenopathy.     Left upper body: No supraclavicular or axillary adenopathy.  Skin:    General: Skin is warm and dry.  Neurological:     Mental Status: She is alert and oriented to person, place, and time.     Data Reviewed Mammograms and ultrasound from August 20, 2018 through September 27, 2018 were reviewed.  September 13, 2018 biopsy results: DIAGNOSIS:  A. BREAST, RIGHT, 9:00; ULTRASOUND-GUIDED BIOPSY:  - SCLEROSING DUCTAL PAPILLOMA.  - USUAL DUCT HYPERPLASIA.  - NEGATIVE FOR MALIGNANCY.  Venus clip   Core sample by 5, 14-gauge spring-loaded device.  B. BREAST, RIGHT, 930; ULTRASOUND-GUIDED BIOPSY:  - MINUTE INTRADUCTAL PAPILLOMA AND FIBROCYSTIC CHANGES.  - NEGATIVE FOR MALIGNANCY.  Heart clip Core sample x3, 14-gauge spring-loaded device.  September 27, 2018 stereotactic biopsy results. Marland Kitchen BREAST, RIGHT, OUTER LOWER QUADRANT; STEREOTACTIC-GUIDED CORE BIOPSY:  - 3 MM GROUP OF MICROCYSTS WITH USUAL DUCTAL HYPERPLASIA AND APOCRINE  METAPLASIA.  - NEGATIVE FOR ATYPIA AND  MALIGNANCY.  "X" clip.  Assessment    2 small papilloma within 2 cm of each other in the 9-930 o'clock position of the right breast.  No atypia.  Lower outer quadrant lesion without any pathologic finding.      Plan   Options for management were reviewed.  1) proceed to excisional biopsy to confirm benign pathology versus 2) observation with repeat right breast diagnostic mammogram in 6  months.  The possibility of a missed lesion is small, but as the papillomas were biopsied with a 14-gauge spring-loaded device with a much smaller tissue volume returned than the stereotactic vacuum device, formal excision is not inappropriate.  The patient will require wire localization if she desires to proceed to biopsy.  Pros and cons of both courses were reviewed.  Likelihood of a significant change in options with a six-month follow-up is essentially 0.  The patient has undergone open biopsy procedure in the distant past with a Dr. Darvin Neighbours.  She tolerated this well.  She is a insulin-dependent diabetic and reports that her sugars on a good day run about 180, can run up into the high 200s.  This would not be a contraindication to open biopsy but certainly might make administration of preoperative antibiotics appropriate.  The patient will notify the office of her decision and we will proceed from there.  HPI, Physical Exam, Assessment and Plan have been scribed under the direction and in the presence of Earline Mayotte, MD. Terry Hickman, CMA   I have completed the exam and reviewed the above documentation for accuracy and completeness.  I agree with the above.  Museum/gallery conservator has been used and any errors in dictation or transcription are unintentional.  Terry Hickman, M.D., F.A.C.S.  Terry Hickman 10/18/2018, 3:53 PM

## 2018-10-22 ENCOUNTER — Telehealth: Payer: Self-pay | Admitting: *Deleted

## 2018-10-22 NOTE — Telephone Encounter (Signed)
Note to Dr.Byrnett to complete surgery sheet.

## 2018-10-22 NOTE — Telephone Encounter (Signed)
Patient wants to proceed with breast surgery

## 2018-10-24 ENCOUNTER — Telehealth: Payer: Self-pay | Admitting: *Deleted

## 2018-10-24 NOTE — Telephone Encounter (Signed)
I spoke with the patient regarding plans for upcoming breast biopsy on November 19, 2018.  The areas of papilloma were the targets for my planned biopsy, as they were small volume samples and there is some question about possible upstaging.  The third area biopsied by stereotactic technique included 3 cm of tissue and was concordant both by the radiologist interpretation as well as my own, and this does not warrant separate excisional biopsy.  This is far enough away from the papillomas that would likely require 2 incisions and I do not think really would benefit the patient in any way.  At this time the patient is comfortable with removal of the 2 papillomas only and this will be done with wire localization on March 9 as noted above.

## 2018-10-24 NOTE — Telephone Encounter (Signed)
Patient called the office today wanting to get surgery arranged for 11-19-18.   The patient is wanting to confirm that she will be having all 3 areas in her breast taken out at the time of surgery.   Patient has 2 papillomas at 9 and 9:30 position and also had a stereo biopsy completed as well. She states that path report came back benign.   Message to Dr. Bary Castilla regarding the above.

## 2018-10-25 ENCOUNTER — Other Ambulatory Visit: Payer: Self-pay | Admitting: General Surgery

## 2018-10-25 ENCOUNTER — Other Ambulatory Visit: Payer: Self-pay | Admitting: *Deleted

## 2018-10-25 ENCOUNTER — Ambulatory Visit: Payer: BLUE CROSS/BLUE SHIELD | Admitting: Obstetrics & Gynecology

## 2018-10-25 ENCOUNTER — Telehealth: Payer: Self-pay | Admitting: *Deleted

## 2018-10-25 DIAGNOSIS — D241 Benign neoplasm of right breast: Secondary | ICD-10-CM

## 2018-10-25 NOTE — Telephone Encounter (Signed)
Patient contacted the office today.   Surgery has been scheduled for 11-19-18 at [facility] with Dr. Bary Castilla. The patient is aware to arrive at the [facility] at 7:45 am day of surgery.   She is aware the Pre-admission Testing Department will be contacting her on 11-05-18 between 9 am and 1 pm to do a phone interview.   Patient requesting to stop by and pick up surgery instructions. She states she will stop by tomorrow and pick up. Paperwork placed in the bin at [employer]. Instructions reviewed by phone with the patient today.   The patient is aware to call the office should she have further questions.

## 2018-10-25 NOTE — Telephone Encounter (Signed)
Patient contacted and notified that per the Pre-admission Testing Department, they need to have patient come in the office due to history.   This has been scheduled for 11-05-18 at 10 am. Patient aware and verbalizes understanding.

## 2018-10-25 NOTE — Telephone Encounter (Signed)
-----   Message from Sherrie Sport sent at 10/25/2018  2:20 PM EST ----- 7:45am check in time  Thanks  ----- Message ----- From: Dominga Ferry, CMA Sent: 10/25/2018   1:35 PM EST To: Sherrie Sport  We have this patient scheduled for surgery on 11-19-18 at 10:30 am. Patient needs to have a needle loc of the 9 and 9:30 o'clock positions. Can you please arrange and let me know what time patient needs to report to [facility]? Thanks.

## 2018-11-05 ENCOUNTER — Telehealth: Payer: Self-pay | Admitting: General Surgery

## 2018-11-05 ENCOUNTER — Telehealth: Payer: Self-pay

## 2018-11-05 ENCOUNTER — Other Ambulatory Visit: Payer: Self-pay

## 2018-11-05 ENCOUNTER — Encounter
Admission: RE | Admit: 2018-11-05 | Discharge: 2018-11-05 | Disposition: A | Discharge status: Home / Self Care | Payer: BLUE CROSS/BLUE SHIELD | Source: Ambulatory Visit | Attending: General Surgery | Admitting: General Surgery

## 2018-11-05 DIAGNOSIS — Z01818 Encounter for other preprocedural examination: Secondary | ICD-10-CM | POA: Diagnosis present

## 2018-11-05 HISTORY — DX: Nausea with vomiting, unspecified: R11.2

## 2018-11-05 HISTORY — DX: Other specified postprocedural states: Z98.890

## 2018-11-05 HISTORY — DX: Family history of other specified conditions: Z84.89

## 2018-11-05 LAB — CBC
HCT: 42.2 % (ref 36.0–46.0)
Hemoglobin: 13.9 g/dL (ref 12.0–15.0)
MCH: 29.6 pg (ref 26.0–34.0)
MCHC: 32.9 g/dL (ref 30.0–36.0)
MCV: 90 fL (ref 80.0–100.0)
Platelets: 274 10*3/uL (ref 150–400)
RBC: 4.69 MIL/uL (ref 3.87–5.11)
RDW: 12.5 % (ref 11.5–15.5)
WBC: 7.1 10*3/uL (ref 4.0–10.5)
nRBC: 0 % (ref 0.0–0.2)

## 2018-11-05 LAB — BASIC METABOLIC PANEL
Anion gap: 9 (ref 5–15)
BUN: 11 mg/dL (ref 8–23)
CO2: 27 mmol/L (ref 22–32)
Calcium: 8.7 mg/dL — ABNORMAL LOW (ref 8.9–10.3)
Chloride: 103 mmol/L (ref 98–111)
Creatinine, Ser: 0.69 mg/dL (ref 0.44–1.00)
GFR calc Af Amer: >60 mL/min (ref >60)
GFR calc non Af Amer: >60 mL/min (ref >60)
Glucose, Bld: 226 mg/dL — ABNORMAL HIGH (ref 70–99)
Potassium: 3.1 mmol/L — ABNORMAL LOW (ref 3.5–5.1)
Sodium: 139 mmol/L (ref 135–145)

## 2018-11-05 NOTE — Telephone Encounter (Signed)
-----   Message from Robert Bellow, MD sent at 11/05/2018  2:00 PM EST ----- Please ask the patient to take her KCl tablets (10 mEq in record) three times a day between now and surgery.  ----- Message ----- From: Buel Ream, Lab In [place] Sent: 11/05/2018  11:26 AM EST To: Robert Bellow, MD

## 2018-11-05 NOTE — Telephone Encounter (Signed)
Notified patient as instructed, patient pleased. Discussed follow-up appointments, patient agrees Patient will start taking 3, KCI 10 mEq tablets a day between now and surgery.

## 2018-11-05 NOTE — Patient Instructions (Signed)
Your procedure is scheduled on: Monday 11/19/2018 Report to [place]. To find out your arrival time please call [phone] between 1PM - 3PM on Friday 11/16/2018.  Remember: Instructions that are not followed completely may result in serious medical risk, up to and including death, or upon the discretion of your surgeon and anesthesiologist your surgery may need to be rescheduled.     _X__ 1. Do not eat food after midnight the night before your procedure.                 No gum chewing or hard candies. You may drink clear liquids up to 2 hours                 before you are scheduled to arrive for your surgery- DO not drink clear                 liquids within 2 hours of the start of your surgery.                 Clear Liquids include:  water, apple juice without pulp, clear carbohydrate                 drink such as Clearfast or Gatorade, Black Coffee or Tea (Do not add                 anything to coffee or tea).  __X__2.  On the morning of surgery brush your teeth with toothpaste and water, you                 may rinse your mouth with mouthwash if you wish.  Do not swallow any              toothpaste of mouthwash.     _X__ 3.  No Alcohol for 24 hours before or after surgery.   _X__ 4.  Do Not Smoke or use e-cigarettes For 24 Hours Prior to Your Surgery.                 Do not use any chewable tobacco products for at least 6 hours prior to                 surgery.  ____  5.  Bring all medications with you on the day of surgery if instructed.   __X__  6.  Notify your doctor if there is any change in your medical condition      (cold, fever, infections).     Do not wear jewelry, make-up, hairpins, clips or nail polish. Do not wear lotions, powders, or perfumes.  Do not shave 48 hours prior to surgery. Men may shave face and neck. Do not bring valuables to the hospital.    [facility] is not responsible for any belongings or  valuables.  Contacts, dentures/partials or body piercings may not be worn into surgery. Bring a case for your contacts, glasses or hearing aids, a denture cup will be supplied. Leave your suitcase in the car. After surgery it may be brought to your room. For patients admitted to the hospital, discharge time is determined by your treatment team.   Patients discharged the day of surgery will not be allowed to drive home.   Please read over the following fact sheets that you were given:   MRSA Information  __X__ Take these medicines the morning of surgery with A SIP OF WATER:  1. omeprazole (PRILOSEC) at bedtime and again the morning of procedure  2.   3.   4.  5.  6.  ____ Fleet Enema (as directed)   __X__ Use CHG Soap/SAGE wipes as directed  ____ Use inhalers on the day of surgery  ____ Stop metformin/Janumet/Farxiga 2 days prior to surgery    __X__ Take 1/2 of usual insulin dose the night before surgery. No insulin the morning          of surgery.   ____ Stop Blood Thinners Coumadin/Plavix/Xarelto/Pleta/Pradaxa/Eliquis/Effient/Aspirin  on   Or contact your Surgeon, Cardiologist or Medical Doctor regarding  ability to stop your blood thinners  __X__ Stop Anti-inflammatories 7 days before surgery such as Advil, Ibuprofen, Motrin,  BC or Goodies Powder, Naprosyn, Naproxen, Aleve, Aspirin   OK TO TAKE TYLENOL ID NEEDED   __X__ Stop all herbal supplements, fish oil or vitamin E until after surgery. STOP VITAMIN C 7 DAYS BEFORE, EVERYTHING ELSE IS OK TO CONTINUE   __X__ Bring C-Pap to the hospital.

## 2018-11-05 NOTE — Telephone Encounter (Signed)
Patient has called the office, per the request of pre admission testing today at her visit. Patient had a sore that came up on her right lower leg. She has seen her PCP concerning this and was given doxycycline to take for 2 weeks. She states that she has 4 days left of taking the antibiotic. The sore has improved-no draining/pus, no redness or heat. Patient states that she assumes she was asked to inform Dr Bary Castilla is because of her upcoming surgery and bc she is diabetic type 1. Please call patient if there is any concerns that would delay her surgery. Sx is with Dr Bary Castilla on 11/19/18-excision of right breast papilloma with NL X2.   I have informed the patient that should the sore begin to be troublesome, to please contact her PCP asap. I did assure her that Dr Bary Castilla would be informed of her call.

## 2018-11-05 NOTE — Telephone Encounter (Signed)
Patient notified that as long as she did not have an active infection at the time of her surgery she would be able to have surgery. Patient encouraged to have follow up appointment with PCP prior to surgery so she can be sure there are no problems with her leg. She is aware and will schedule this.

## 2018-11-11 DIAGNOSIS — C4490 Unspecified malignant neoplasm of skin, unspecified: Secondary | ICD-10-CM

## 2018-11-11 HISTORY — DX: Unspecified malignant neoplasm of skin, unspecified: C44.90

## 2018-11-16 MED ORDER — FAMOTIDINE 20 MG PO TABS: 20.0000 mg | ORAL_TABLET | Freq: Once | ORAL | Status: DC

## 2018-11-19 ENCOUNTER — Ambulatory Visit: Payer: BLUE CROSS/BLUE SHIELD | Admitting: Registered Nurse

## 2018-11-19 ENCOUNTER — Other Ambulatory Visit: Payer: Self-pay

## 2018-11-19 ENCOUNTER — Encounter: Payer: Self-pay | Admitting: *Deleted

## 2018-11-19 ENCOUNTER — Ambulatory Visit
Admission: RE | Admit: 2018-11-19 | Discharge: 2018-11-19 | Disposition: A | Discharge status: Home / Self Care | Payer: BLUE CROSS/BLUE SHIELD | Source: Ambulatory Visit | Attending: General Surgery | Admitting: General Surgery

## 2018-11-19 ENCOUNTER — Ambulatory Visit
Admission: RE | Admit: 2018-11-19 | Discharge: 2018-11-19 | Disposition: A | Discharge status: Home / Self Care | Payer: BLUE CROSS/BLUE SHIELD | Attending: General Surgery | Admitting: General Surgery

## 2018-11-19 ENCOUNTER — Encounter
Admission: RE | Disposition: A | Discharge status: Home / Self Care | Payer: Self-pay | Source: Home / Self Care | Attending: General Surgery

## 2018-11-19 DIAGNOSIS — Z794 Long term (current) use of insulin: Secondary | ICD-10-CM | POA: Insufficient documentation

## 2018-11-19 DIAGNOSIS — E538 Deficiency of other specified B group vitamins: Secondary | ICD-10-CM | POA: Diagnosis not present

## 2018-11-19 DIAGNOSIS — Z7982 Long term (current) use of aspirin: Secondary | ICD-10-CM | POA: Insufficient documentation

## 2018-11-19 DIAGNOSIS — E7800 Pure hypercholesterolemia, unspecified: Secondary | ICD-10-CM | POA: Insufficient documentation

## 2018-11-19 DIAGNOSIS — K219 Gastro-esophageal reflux disease without esophagitis: Secondary | ICD-10-CM | POA: Diagnosis not present

## 2018-11-19 DIAGNOSIS — E109 Type 1 diabetes mellitus without complications: Secondary | ICD-10-CM | POA: Insufficient documentation

## 2018-11-19 DIAGNOSIS — D241 Benign neoplasm of right breast: Secondary | ICD-10-CM

## 2018-11-19 DIAGNOSIS — N6091 Unspecified benign mammary dysplasia of right breast: Secondary | ICD-10-CM | POA: Insufficient documentation

## 2018-11-19 DIAGNOSIS — G4730 Sleep apnea, unspecified: Secondary | ICD-10-CM | POA: Diagnosis not present

## 2018-11-19 DIAGNOSIS — I10 Essential (primary) hypertension: Secondary | ICD-10-CM | POA: Diagnosis not present

## 2018-11-19 DIAGNOSIS — Z885 Allergy status to narcotic agent status: Secondary | ICD-10-CM | POA: Insufficient documentation

## 2018-11-19 DIAGNOSIS — E559 Vitamin D deficiency, unspecified: Secondary | ICD-10-CM | POA: Diagnosis not present

## 2018-11-19 DIAGNOSIS — Z79899 Other long term (current) drug therapy: Secondary | ICD-10-CM | POA: Diagnosis not present

## 2018-11-19 HISTORY — PX: BREAST BIOPSY: SHX20

## 2018-11-19 HISTORY — PX: BREAST LUMPECTOMY: SHX2

## 2018-11-19 LAB — GLUCOSE, CAPILLARY
GLUCOSE-CAPILLARY: 310 mg/dL — AB (ref 70–99)
Glucose-Capillary: 280 mg/dL — ABNORMAL HIGH (ref 70–99)
Glucose-Capillary: 215 mg/dL — ABNORMAL HIGH (ref 70–99)
Glucose-Capillary: 250 mg/dL — ABNORMAL HIGH (ref 70–99)

## 2018-11-19 LAB — POCT I-STAT 4, (NA,K, GLUC, HGB,HCT)
Glucose, Bld: 353 mg/dL — ABNORMAL HIGH (ref 70–99)
HCT: 40 % (ref 36.0–46.0)
Hemoglobin: 13.6 g/dL (ref 12.0–15.0)
Potassium: 3.6 mmol/L (ref 3.5–5.1)
Sodium: 139 mmol/L (ref 135–145)

## 2018-11-19 SURGERY — BREAST BIOPSY WITH NEEDLE LOCALIZATION
Anesthesia: General | Laterality: Right

## 2018-11-19 MED ORDER — PROMETHAZINE HCL 25 MG/ML IJ SOLN: 6.2500 mg | INTRAMUSCULAR | Status: DC | PRN

## 2018-11-19 MED ORDER — MEPERIDINE HCL 50 MG/ML IJ SOLN: 6.2500 mg | INTRAMUSCULAR | Status: DC | PRN

## 2018-11-19 MED ORDER — DEXAMETHASONE SODIUM PHOSPHATE 10 MG/ML IJ SOLN
INTRAMUSCULAR | Status: AC
  Filled 2018-11-19: qty 1

## 2018-11-19 MED ORDER — PHENYLEPHRINE HCL 10 MG/ML IJ SOLN
INTRAMUSCULAR | Status: AC
  Filled 2018-11-19: qty 1

## 2018-11-19 MED ORDER — FENTANYL CITRATE (PF) 100 MCG/2ML IJ SOLN
INTRAMUSCULAR | Status: DC | PRN
  Administered 2018-11-19: 50 ug via INTRAVENOUS
  Administered 2018-11-19 (×2): 25 ug via INTRAVENOUS

## 2018-11-19 MED ORDER — MIDAZOLAM HCL 2 MG/2ML IJ SOLN
INTRAMUSCULAR | Status: AC
  Filled 2018-11-19: qty 2

## 2018-11-19 MED ORDER — ACETAMINOPHEN 10 MG/ML IV SOLN
INTRAVENOUS | Status: DC | PRN
  Administered 2018-11-19: 1000 mg via INTRAVENOUS

## 2018-11-19 MED ORDER — GABAPENTIN 300 MG PO CAPS
300.0000 mg | ORAL_CAPSULE | ORAL | Status: AC
  Administered 2018-11-19: 300 mg via ORAL

## 2018-11-19 MED ORDER — BUPIVACAINE HCL (PF) 0.5 % IJ SOLN
INTRAMUSCULAR | Status: AC
  Filled 2018-11-19: qty 30

## 2018-11-19 MED ORDER — FENTANYL CITRATE (PF) 100 MCG/2ML IJ SOLN
INTRAMUSCULAR | Status: AC
  Filled 2018-11-19: qty 2

## 2018-11-19 MED ORDER — DEXAMETHASONE SODIUM PHOSPHATE 10 MG/ML IJ SOLN
INTRAMUSCULAR | Status: DC | PRN
  Administered 2018-11-19: 5 mg via INTRAVENOUS

## 2018-11-19 MED ORDER — GABAPENTIN 300 MG PO CAPS
ORAL_CAPSULE | ORAL | Status: AC
  Administered 2018-11-19: 300 mg via ORAL
  Filled 2018-11-19: qty 1

## 2018-11-19 MED ORDER — PROPOFOL 10 MG/ML IV BOLUS
INTRAVENOUS | Status: DC | PRN
  Administered 2018-11-19: 150 mg via INTRAVENOUS

## 2018-11-19 MED ORDER — TRAMADOL HCL 50 MG PO TABS: 50.0000 mg | ORAL_TABLET | ORAL | 0 refills | Status: DC | PRN

## 2018-11-19 MED ORDER — SODIUM CHLORIDE 0.9 % IV SOLN
INTRAVENOUS | Status: DC
  Administered 2018-11-19: 09:00:00 via INTRAVENOUS

## 2018-11-19 MED ORDER — OXYCODONE HCL 5 MG PO TABS: 5.0000 mg | ORAL_TABLET | Freq: Once | ORAL | Status: DC | PRN

## 2018-11-19 MED ORDER — MIDAZOLAM HCL 2 MG/2ML IJ SOLN
INTRAMUSCULAR | Status: DC | PRN
  Administered 2018-11-19: 2 mg via INTRAVENOUS

## 2018-11-19 MED ORDER — ONDANSETRON HCL 4 MG/2ML IJ SOLN
INTRAMUSCULAR | Status: AC
  Filled 2018-11-19: qty 2

## 2018-11-19 MED ORDER — OXYCODONE HCL 5 MG/5ML PO SOLN: 5.0000 mg | Freq: Once | ORAL | Status: DC | PRN

## 2018-11-19 MED ORDER — SCOPOLAMINE 1 MG/3DAYS TD PT72
MEDICATED_PATCH | TRANSDERMAL | Status: AC
  Filled 2018-11-19: qty 1

## 2018-11-19 MED ORDER — LIDOCAINE HCL (PF) 2 % IJ SOLN
INTRAMUSCULAR | Status: AC
  Filled 2018-11-19: qty 10

## 2018-11-19 MED ORDER — LIDOCAINE HCL (CARDIAC) PF 100 MG/5ML IV SOSY
PREFILLED_SYRINGE | INTRAVENOUS | Status: DC | PRN
  Administered 2018-11-19: 100 mg via INTRAVENOUS

## 2018-11-19 MED ORDER — PROPOFOL 500 MG/50ML IV EMUL
INTRAVENOUS | Status: DC | PRN
  Administered 2018-11-19: 20 ug/kg/min via INTRAVENOUS

## 2018-11-19 MED ORDER — ONDANSETRON HCL 4 MG/2ML IJ SOLN
INTRAMUSCULAR | Status: DC | PRN
  Administered 2018-11-19: 4 mg via INTRAVENOUS

## 2018-11-19 MED ORDER — PROPOFOL 10 MG/ML IV BOLUS
INTRAVENOUS | Status: AC
  Filled 2018-11-19: qty 20

## 2018-11-19 MED ORDER — SCOPOLAMINE 1 MG/3DAYS TD PT72
1.0000 | MEDICATED_PATCH | TRANSDERMAL | Status: DC
  Administered 2018-11-19: 1.5 mg via TRANSDERMAL

## 2018-11-19 MED ORDER — ACETAMINOPHEN 10 MG/ML IV SOLN
INTRAVENOUS | Status: AC
  Filled 2018-11-19: qty 100

## 2018-11-19 MED ORDER — BUPIVACAINE-EPINEPHRINE 0.5% -1:200000 IJ SOLN
INTRAMUSCULAR | Status: DC | PRN
  Administered 2018-11-19: 30 mL

## 2018-11-19 MED ORDER — EPINEPHRINE PF 1 MG/ML IJ SOLN
INTRAMUSCULAR | Status: AC
  Filled 2018-11-19: qty 1

## 2018-11-19 MED ORDER — PROPOFOL 500 MG/50ML IV EMUL
INTRAVENOUS | Status: AC
  Filled 2018-11-19: qty 50

## 2018-11-19 MED ORDER — PHENYLEPHRINE HCL 10 MG/ML IJ SOLN
INTRAMUSCULAR | Status: DC | PRN
  Administered 2018-11-19: 100 ug via INTRAVENOUS

## 2018-11-19 MED ORDER — FENTANYL CITRATE (PF) 100 MCG/2ML IJ SOLN: 25.0000 ug | INTRAMUSCULAR | Status: DC | PRN

## 2018-11-19 SURGICAL SUPPLY — 45 items
BINDER BREAST LRG (GAUZE/BANDAGES/DRESSINGS) IMPLANT
BINDER BREAST MEDIUM (GAUZE/BANDAGES/DRESSINGS) IMPLANT
BINDER BREAST XLRG (GAUZE/BANDAGES/DRESSINGS) IMPLANT
BINDER BREAST XXLRG (GAUZE/BANDAGES/DRESSINGS) IMPLANT
BLADE SURG 15 STRL SS SAFETY (BLADE) ×4 IMPLANT
CANISTER SUCT 1200ML W/VALVE (MISCELLANEOUS) ×2 IMPLANT
CHLORAPREP W/TINT 26 (MISCELLANEOUS) ×4 IMPLANT
CNTNR SPEC 2.5X3XGRAD LEK (MISCELLANEOUS)
CONT SPEC 4OZ STER OR WHT (MISCELLANEOUS)
CONTAINER SPEC 2.5X3XGRAD LEK (MISCELLANEOUS) IMPLANT
COVER PROBE FLX POLY STRL (MISCELLANEOUS) ×2 IMPLANT
COVER WAND RF STERILE (DRAPES) ×2 IMPLANT
DEVICE DUBIN SPECIMEN MAMMOGRA (MISCELLANEOUS) ×2 IMPLANT
DRAPE CHEST BREAST 77X106 FENE (MISCELLANEOUS) ×2 IMPLANT
DRAPE LAPAROTOMY 100X77 ABD (DRAPES) ×2 IMPLANT
DRSG GAUZE FLUFF 36X18 (GAUZE/BANDAGES/DRESSINGS) ×2 IMPLANT
DRSG TELFA 4X3 1S NADH ST (GAUZE/BANDAGES/DRESSINGS) ×4 IMPLANT
ELECT CAUTERY BLADE TIP 2.5 (TIP) ×2
ELECT REM PT RETURN 9FT ADLT (ELECTROSURGICAL) ×2
ELECTRODE CAUTERY BLDE TIP 2.5 (TIP) ×1 IMPLANT
ELECTRODE REM PT RTRN 9FT ADLT (ELECTROSURGICAL) ×1 IMPLANT
GLOVE BIO SURGEON STRL SZ7.5 (GLOVE) ×4 IMPLANT
GLOVE INDICATOR 8.0 STRL GRN (GLOVE) ×4 IMPLANT
GOWN STRL REUS W/ TWL LRG LVL3 (GOWN DISPOSABLE) ×2 IMPLANT
GOWN STRL REUS W/TWL LRG LVL3 (GOWN DISPOSABLE) ×2
KIT TURNOVER KIT A (KITS) ×2 IMPLANT
LABEL OR SOLS (LABEL) ×2 IMPLANT
MARGIN MAP 10MM (MISCELLANEOUS) ×2 IMPLANT
NEEDLE HYPO 22GX1.5 SAFETY (NEEDLE) ×2 IMPLANT
NEEDLE HYPO 25X1 1.5 SAFETY (NEEDLE) ×2 IMPLANT
PACK BASIN MINOR ARMC (MISCELLANEOUS) ×2 IMPLANT
RETRACTOR RING XSMALL (MISCELLANEOUS) IMPLANT
RTRCTR WOUND ALEXIS 13CM XS SH (MISCELLANEOUS)
STRIP CLOSURE SKIN 1/2X4 (GAUZE/BANDAGES/DRESSINGS) ×2 IMPLANT
SUT ETHILON 3-0 FS-10 30 BLK (SUTURE) ×2
SUT VIC AB 2-0 CT1 27 (SUTURE) ×1
SUT VIC AB 2-0 CT1 TAPERPNT 27 (SUTURE) ×1 IMPLANT
SUT VIC AB 2-0 SH 27 (SUTURE) ×1
SUT VIC AB 2-0 SH 27XBRD (SUTURE) ×1 IMPLANT
SUT VIC AB 4-0 FS2 27 (SUTURE) ×2 IMPLANT
SUTURE EHLN 3-0 FS-10 30 BLK (SUTURE) ×1 IMPLANT
SWABSTK COMLB BENZOIN TINCTURE (MISCELLANEOUS) ×2 IMPLANT
SYR 10ML LL (SYRINGE) ×2 IMPLANT
TAPE TRANSPORE STRL 2 31045 (GAUZE/BANDAGES/DRESSINGS) IMPLANT
WATER STERILE IRR 1000ML POUR (IV SOLUTION) ×2 IMPLANT

## 2018-11-19 NOTE — OR Nursing (Signed)
Patient returned from Mammography. Blood sugar 280, Dr. Randa Lynn and Dr. Bary Castilla notified.

## 2018-11-19 NOTE — Transfer of Care (Signed)
Immediate Anesthesia Transfer of Care Note  Patient: Terry Hickman  Procedure(s) Performed: EXCISION RIGHT BREAST PAPILLOMA WITH NEEDLE LOCALIZATION X2 10:30 START PER MAMMO (Right )  Patient Location: PACU  Anesthesia Type:General  Level of Consciousness: sedated  Airway & Oxygen Therapy: Patient Spontanous Breathing and Patient connected to face mask oxygen  Post-op Assessment: Report given to RN and Post -op Vital signs reviewed and stable  Post vital signs: Reviewed and stable  Last Vitals:  Vitals Value Taken Time  BP 104/55 11/19/2018 10:30 AM  Temp 36.1 C 11/19/2018 10:30 AM  Pulse 72 11/19/2018 10:31 AM  Resp 18 11/19/2018 10:31 AM  SpO2 97 % 11/19/2018 10:31 AM  Vitals shown include unvalidated device data.  Last Pain:  Vitals:   11/19/18 1030  TempSrc:   PainSc: 0-No pain         Complications: No apparent anesthesia complications

## 2018-11-19 NOTE — Anesthesia Procedure Notes (Signed)
Procedure Name: LMA Insertion Date/Time: 11/19/2018 9:45 AM Performed by: Hedda Slade, CRNA Pre-anesthesia Checklist: Patient identified, Patient being monitored, Timeout performed, Emergency Drugs available and Suction available Patient Re-evaluated:Patient Re-evaluated prior to induction Oxygen Delivery Method: Circle system utilized Preoxygenation: Pre-oxygenation with 100% oxygen Induction Type: IV induction Ventilation: Mask ventilation without difficulty LMA: LMA inserted LMA Size: 3.5 Tube type: Oral Number of attempts: 1 Placement Confirmation: positive ETCO2 and breath sounds checked- equal and bilateral Tube secured with: Tape Dental Injury: Teeth and Oropharynx as per pre-operative assessment

## 2018-11-19 NOTE — OR Nursing (Signed)
Patient reports her blood sugar was 360 when she got up this am. She took 6 units of her regular insulin at 5:30 am today. Blood sugar is currently 310. Dr. Randa Lynn notified and reports it is okay to take patient down for needle placement and we will recheck her BS when she returns to [place] in [place].

## 2018-11-19 NOTE — Op Note (Signed)
Preoperative diagnosis: Papilloma of the right breast.  Postoperative diagnosis: Same.  Operative procedure: Wire localization of right breast lesion (x2) with excision.  Operating Surgeon: Hervey Ard, MD  Anesthesia: General by LMA, Marcaine 0.5% with 1 to 200,000 units of epinephrine, 30 cc.  Estimated blood loss: Less than 5 cc.  Clinical note: The patient had previously undergone 2 biopsies of the right breast 1 showing no sclerosis seen ductal papilloma and the second with a minute intraductal papilloma.  Samples with were small gauge spring-loaded biopsy devices.  No atypia noted.  It was elected to proceed to excision.  Operative note: The patient's blood sugars been high the day of surgery and she received insulin with improvement.  She underwent general anesthesia without difficulty.  The breast chest and axilla was cleansed with ChloraPrep and draped.  A radial incision was made extending from the edge of the areola to the periphery of the breast for about 4 cm.  This was made sharply and the subcutaneous fat elevated to allow placement of an extra small [facility]rium wound protector.  A block of tissue approximately 2.5 x 3 x 3 cm in diameter was excised orientated and sent for specimen radiograph he.  This confirmed both localizing wires and clips as reported by the radiologist.  Good hemostasis was noted.  The deep tissue was approximated with layers of 2-0 Vicryl figure-of-eight sutures.  The adipose layer was approximated with a 2-0 Vicryl running suture.  The skin closed with a running 4-0 Vicryl subcuticular suture.  Benzoin Steri-Strips followed by Telfa fluff gauze and a compressive wrap were applied.  Patient tolerated the procedure well and was taken to recovery room in stable condition.

## 2018-11-19 NOTE — H&P (Signed)
Terry Hickman [id number] 06-24-1956     HPI:  63 y/o with a papilloma for excision. Tolerated needle localization well. BS elevated this AM. Took additional insulin prior to presentation today.   Medications Prior to Admission  Medication Sig Dispense Refill Last Dose  . Ascorbic Acid (VITAMIN C) 1000 MG tablet Take 1,000 mg by mouth daily.    11/15/2018  . ASPIRIN 81 PO Take 81 mg by mouth daily.    11/15/2018  . atorvastatin (LIPITOR) 40 MG tablet Take 40 mg by mouth at bedtime.    11/17/2018  . Calcium Carbonate-Vit D-Min (CALCIUM 1200 PO) Take 1,200 mg by mouth daily.   11/18/2018  . cyanocobalamin 1000 MCG tablet Take 1,000 mcg by mouth daily.   Taking  . ergocalciferol (VITAMIN D2) 50000 units capsule Take 50,000 Units by mouth once a week.   11/18/2018  . furosemide (LASIX) 20 MG tablet Take 20 mg by mouth daily.    11/17/2018  . hydrochlorothiazide (HYDRODIURIL) 25 MG tablet Take 25 mg by mouth daily.   11/18/2018 at Unknown time  . Insulin Glargine, 2 Unit Dial, (TOUJEO MAX SOLOSTAR) 300 UNIT/ML SOPN Inject 68 Units into the skin at bedtime.   11/18/2018 at 2100  . insulin lispro (HUMALOG) 100 UNIT/ML injection Inject 12-20 Units into the skin 3 (three) times daily before meals.   11/19/2018 at 0530  . losartan (COZAAR) 50 MG tablet Take 50 mg by mouth daily.   11/18/2018 at Unknown time  . Melatonin 3 MG TABS Take 6 mg by mouth at bedtime.    11/18/2018 at Unknown time  . nystatin cream (MYCOSTATIN) Apply 1 application topically 2 (two) times daily. PRN (Patient taking differently: Apply 1 application topically 2 (two) times daily as needed for dry skin. ) 30 g 6 Taking  . omeprazole (PRILOSEC) 20 MG capsule Take 20 mg by mouth daily.    11/19/2018 at 0530  . Polyethyl Glycol-Propyl Glycol (SYSTANE OP) Place 1 drop into both eyes 2 (two) times daily as needed (dry eyes).   11/19/2018 at 0530  . potassium chloride (MICRO-K) 10 MEQ CR capsule Take 10 mEq by mouth daily.   11/18/2018 at Unknown time  .  doxycycline (VIBRA-TABS) 100 MG tablet Take 100 mg by mouth 2 (two) times daily.   Completed Course at Unknown time  . glucose blood (ONE TOUCH ULTRA TEST) test strip Use 4 (four) times daily. Use as instructed.   Taking  . Insulin Pen Needle (FIFTY50 PEN NEEDLES) 31G X 5 MM MISC USE WITH INSULIN   Taking   Allergies  Allergen Reactions  . Codeine Nausea Only   Past Medical History:  Diagnosis Date  . Anemia   . DDD (degenerative disc disease), cervical   . Diabetes mellitus without complication (HCC)   . Duodenal ulcer   . Family history of adverse reaction to anesthesia    mother = PONV  . Fibrocystic breast disease   . Gall stone   . GERD (gastroesophageal reflux disease)   . Herpes zoster   . Hypercholesterolemia   . Hypertension   . Hyperthyroidism   . PONV (postoperative nausea and vomiting)   . Shingles   . Sleep apnea   . Vitamin B 12 deficiency   . Vitamin D deficiency    Past Surgical History:  Procedure Laterality Date  . ABDOMINAL HYSTERECTOMY    . BREAST BIOPSY Right 09/13/2018   Papilloma, [place] bx  . BREAST BIOPSY Right 09/13/2018   Papilloma, [place]  bx  . BREAST BIOPSY Right 09/27/2018   affirm bx of mass, path pending x marker  . BREAST EXCISIONAL BIOPSY Right 09/12/1990   negative  . COLONOSCOPY WITH PROPOFOL N/A 07/06/2016   Procedure: COLONOSCOPY WITH PROPOFOL;  Surgeon: Scot Jun, MD;  Location: [facility] ENDOSCOPY;  Service: Endoscopy;  Laterality: N/A;  . ECTOPIC PREGNANCY SURGERY    . MANDIBLE SURGERY    . WISDOM TOOTH EXTRACTION     Social History   Socioeconomic History  . Marital status: Married    Spouse name: Not on file  . Number of children: Not on file  . Years of education: Not on file  . Highest education level: Not on file  Occupational History  . Not on file  Social Needs  . Financial resource strain: Not on file  . Food insecurity:    Worry: Not on file    Inability: Not on file  . Transportation needs:    Medical: Not on  file    Non-medical: Not on file  Tobacco Use  . Smoking status: Never Smoker  . Smokeless tobacco: Never Used  Substance and Sexual Activity  . Alcohol use: No  . Drug use: No  . Sexual activity: Yes  Lifestyle  . Physical activity:    Days per week: Not on file    Minutes per session: Not on file  . Stress: Not on file  Relationships  . Social connections:    Talks on phone: Not on file    Gets together: Not on file    Attends religious service: Not on file    Active member of club or organization: Not on file    Attends meetings of clubs or organizations: Not on file    Relationship status: Not on file  . Intimate partner violence:    Fear of current or ex partner: Not on file    Emotionally abused: Not on file    Physically abused: Not on file    Forced sexual activity: Not on file  Other Topics Concern  . Not on file  Social History Narrative  . Not on file   Social History   Social History Narrative  . Not on file     ROS: Negative.     PE: HEENT: Negative. Lungs: Clear. Cardio: RR.   Assessment/Plan:  Proceed with planned right breast excision for papilloma.   Merrily Pew Tacy Chavis 11/19/2018

## 2018-11-19 NOTE — Anesthesia Post-op Follow-up Note (Signed)
Anesthesia QCDR form completed.        

## 2018-11-19 NOTE — Anesthesia Preprocedure Evaluation (Signed)
Anesthesia Evaluation  Patient identified by MRN, date of birth, ID band Patient awake    Reviewed: Allergy & Precautions, NPO status , Patient's Chart, lab work & pertinent test results  History of Anesthesia Complications (+) PONV and history of anesthetic complications  Airway Mallampati: I  TM Distance: >3 FB Neck ROM: Full    Dental no notable dental hx.    Pulmonary sleep apnea and Continuous Positive Airway Pressure Ventilation , neg COPD,    breath sounds clear to auscultation- rhonchi (-) wheezing      Cardiovascular hypertension, Pt. on medications (-) CAD, (-) Past MI, (-) Cardiac Stents and (-) CABG  Rhythm:Regular Rate:Normal - Systolic murmurs and - Diastolic murmurs    Neuro/Psych  Headaches, negative psych ROS   GI/Hepatic Neg liver ROS, PUD, GERD  Medicated,  Endo/Other  diabetes, Type 1, Insulin Dependent  Renal/GU negative Renal ROS     Musculoskeletal  (+) Arthritis ,   Abdominal (+) + obese,   Peds  Hematology  (+) anemia ,   Anesthesia Other Findings Past Medical History: No date: Anemia No date: DDD (degenerative disc disease), cervical No date: Diabetes mellitus without complication (HCC) No date: Duodenal ulcer No date: Family history of adverse reaction to anesthesia     Comment:  mother = PONV No date: Fibrocystic breast disease No date: Gall stone No date: GERD (gastroesophageal reflux disease) No date: Herpes zoster No date: Hypercholesterolemia No date: Hypertension No date: Hyperthyroidism No date: PONV (postoperative nausea and vomiting) No date: Shingles No date: Sleep apnea No date: Vitamin B 12 deficiency No date: Vitamin D deficiency   Reproductive/Obstetrics                             Anesthesia Physical Anesthesia Plan  ASA: III  Anesthesia Plan: General   Post-op Pain Management:    Induction: Intravenous  PONV Risk Score and  Plan: 3 and Ondansetron, Dexamethasone, Midazolam and Scopolamine patch - Pre-op  Airway Management Planned: LMA  Additional Equipment:   Intra-op Plan:   Post-operative Plan:   Informed Consent: I have reviewed the patients History and Physical, chart, labs and discussed the procedure including the risks, benefits and alternatives for the proposed anesthesia with the patient or authorized representative who has indicated his/her understanding and acceptance.     Dental advisory given  Plan Discussed with: CRNA and Anesthesiologist  Anesthesia Plan Comments:         Anesthesia Quick Evaluation

## 2018-11-19 NOTE — OR Nursing (Signed)
Ok to d/c pt to home without Dr. Dwyane Luo visit to postop (per him tele 1140 am)

## 2018-11-19 NOTE — Discharge Instructions (Signed)
AMBULATORY SURGERY  °DISCHARGE INSTRUCTIONS ° ° °1) The drugs that you were given will stay in your system until tomorrow so for the next 24 hours you should not: ° °A) Drive an automobile °B) Make any legal decisions °C) Drink any alcoholic beverage ° ° °2) You may resume regular meals tomorrow.  Today it is better to start with liquids and gradually work up to solid foods. ° °You may eat anything you prefer, but it is better to start with liquids, then soup and crackers, and gradually work up to solid foods. ° ° °3) Please notify your doctor immediately if you have any unusual bleeding, trouble breathing, redness and pain at the surgery site, drainage, fever, or pain not relieved by medication. ° ° ° °4) Additional Instructions: ° ° ° ° ° ° ° °Please contact your physician with any problems or Same Day Surgery at [phone], Monday through Friday 6 am to 4 pm, or [place] [facility] [place] Main number at [phone]. °

## 2018-11-19 NOTE — Anesthesia Postprocedure Evaluation (Signed)
Anesthesia Post Note  Patient: Terry Hickman  Procedure(s) Performed: EXCISION RIGHT BREAST PAPILLOMA WITH NEEDLE LOCALIZATION X2 10:30 START PER MAMMO (Right )  Patient location during evaluation: PACU Anesthesia Type: General Level of consciousness: awake and alert and oriented Pain management: pain level controlled Vital Signs Assessment: post-procedure vital signs reviewed and stable Respiratory status: spontaneous breathing, nonlabored ventilation and respiratory function stable Cardiovascular status: blood pressure returned to baseline and stable Postop Assessment: no signs of nausea or vomiting Anesthetic complications: no     Last Vitals:  Vitals:   11/19/18 1116 11/19/18 1127  BP: 120/77 (!) 125/54  Pulse: 65 60  Resp: 13 16  Temp: (!) 36.3 C (!) 36.3 C  SpO2: 95% 98%    Last Pain:  Vitals:   11/19/18 1127  TempSrc: Tympanic  PainSc: 0-No pain                 Fatoumata Albaugh

## 2018-11-21 ENCOUNTER — Telehealth: Payer: Self-pay | Admitting: General Surgery

## 2018-11-21 LAB — SURGICAL PATHOLOGY

## 2018-11-21 NOTE — Telephone Encounter (Signed)
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  She reports mild soreness.  She will be a candidate for chemoprevention.  Plans are for follow-up on March 17 as planned.

## 2018-11-27 ENCOUNTER — Encounter: Payer: Self-pay | Admitting: General Surgery

## 2018-11-27 ENCOUNTER — Other Ambulatory Visit: Payer: Self-pay

## 2018-11-27 ENCOUNTER — Ambulatory Visit (INDEPENDENT_AMBULATORY_CARE_PROVIDER_SITE_OTHER): Payer: Medicare Other | Admitting: General Surgery

## 2018-11-27 VITALS — BP 130/78 | HR 75 | Temp 97.2°F | Ht 68.0 in | Wt 248.0 lb

## 2018-11-27 DIAGNOSIS — D241 Benign neoplasm of right breast: Secondary | ICD-10-CM

## 2018-11-27 NOTE — Patient Instructions (Addendum)
The patient is aware to call back for any questions or new concerns. Recommend taking Femara or Tamoxifen, she will call back The patient has been asked to return to the office in December with a bilateral diagnostic mammogram.    Letrozole tablets What is this medicine? LETROZOLE (LET roe zole) blocks the production of estrogen. It is used to treat breast cancer. This medicine may be used for other purposes; ask your health care provider or pharmacist if you have questions. COMMON BRAND NAME(S): Femara What should I tell my health care provider before I take this medicine? They need to know if you have any of these conditions: -high cholesterol -liver disease -osteoporosis (weak bones) -an unusual or allergic reaction to letrozole, other medicines, foods, dyes, or preservatives -pregnant or trying to get pregnant -breast-feeding How should I use this medicine? Take this medicine by mouth with a glass of water. You may take it with or without food. Follow the directions on the prescription label. Take your medicine at regular intervals. Do not take your medicine more often than directed. Do not stop taking except on your doctor's advice. Talk to your pediatrician regarding the use of this medicine in children. Special care may be needed. Overdosage: If you think you have taken too much of this medicine contact a poison control center or emergency room at once. NOTE: This medicine is only for you. Do not share this medicine with others. What if I miss a dose? If you miss a dose, take it as soon as you can. If it is almost time for your next dose, take only that dose. Do not take double or extra doses. What may interact with this medicine? Do not take this medicine with any of the following medications: -estrogens, like hormone replacement therapy or birth control pills This medicine may also interact with the following medications: -dietary supplements such as androstenedione or  DHEA -prasterone -tamoxifen This list may not describe all possible interactions. Give your health care provider a list of all the medicines, herbs, non-prescription drugs, or dietary supplements you use. Also tell them if you smoke, drink alcohol, or use illegal drugs. Some items may interact with your medicine. What should I watch for while using this medicine? Tell your doctor or healthcare professional if your symptoms do not start to get better or if they get worse. Do not become pregnant while taking this medicine or for 3 weeks after stopping it. Women should inform their doctor if they wish to become pregnant or think they might be pregnant. There is a potential for serious side effects to an unborn child. Talk to your health care professional or pharmacist for more information. Do not breast-feed while taking this medicine or for 3 weeks after stopping it. This medicine may interfere with the ability to have a child. Talk with your doctor or health care professional if you are concerned about your fertility. Using this medicine for a long time may increase your risk of low bone mass. Talk to your doctor about bone health. You may get drowsy or dizzy. Do not drive, use machinery, or do anything that needs mental alertness until you know how this medicine affects you. Do not stand or sit up quickly, especially if you are an older patient. This reduces the risk of dizzy or fainting spells. You may need blood work done while you are taking this medicine. What side effects may I notice from receiving this medicine? Side effects that you should report to your  doctor or health care professional as soon as possible: -allergic reactions like skin rash, itching, or hives -bone fracture -chest pain -signs and symptoms of a blood clot such as breathing problems; changes in vision; chest pain; severe, sudden headache; pain, swelling, warmth in the leg; trouble speaking; sudden numbness or weakness of the  face, arm or leg -vaginal bleeding Side effects that usually do not require medical attention (report to your doctor or health care professional if they continue or are bothersome): -bone, back, joint, or muscle pain -dizziness -fatigue -fluid retention -headache -hot flashes, night sweats -nausea -weight gain This list may not describe all possible side effects. Call your doctor for medical advice about side effects. You may report side effects to FDA at 1-800-FDA-1088. Where should I keep my medicine? Keep out of the reach of children. Store between 15 and 30 degrees C (59 and 86 degrees F). Throw away any unused medicine after the expiration date. NOTE: This sheet is a summary. It may not cover all possible information. If you have questions about this medicine, talk to your doctor, pharmacist, or health care provider.  2019 Elsevier/Gold Standard (2016-04-04 11:10:41)

## 2018-11-27 NOTE — Progress Notes (Signed)
Patient ID: Terry Hickman, female   DOB: 1956/06/12, 63 y.o.   MRN: [id number]  Chief Complaint  Patient presents with  . Routine Post Op    right breast     HPI Terry Hickman is a 63 y.o. here today for her right breast excision done on 11/19/2018. She stats she is doing well.  HPI  Past Medical History:  Diagnosis Date  . Anemia   . DDD (degenerative disc disease), cervical   . Diabetes mellitus without complication (HCC)   . Duodenal ulcer   . Family history of adverse reaction to anesthesia    mother = PONV  . Fibrocystic breast disease   . Gall stone   . GERD (gastroesophageal reflux disease)   . Herpes zoster   . Hypercholesterolemia   . Hypertension   . Hyperthyroidism   . PONV (postoperative nausea and vomiting)   . Shingles   . 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 Sleep apnea   . Vitamin B 12 deficiency   . Vitamin D deficiency     Past Surgical History:  Procedure Laterality Date  . ABDOMINAL HYSTERECTOMY    . BREAST BIOPSY Right 09/13/2018   Papilloma, [place] bx  . BREAST BIOPSY Right 09/13/2018   Papilloma, [place] bx  . BREAST BIOPSY Right 09/27/2018   affirm bx of mass, path pending x marker  . BREAST BIOPSY Right 11/19/2018   Procedure: EXCISION RIGHT BREAST PAPILLOMA WITH NEEDLE LOCALIZATION X2 10:30 START PER MAMMO;  Surgeon: Earline Mayotte, MD;  Location: [facility] ORS;  Service: General;  Laterality: Right;  . BREAST EXCISIONAL BIOPSY Right 09/12/1990   negative  . BREAST LUMPECTOMY Right 11/19/2018  . COLONOSCOPY WITH PROPOFOL N/A 07/06/2016   Procedure: COLONOSCOPY WITH PROPOFOL;  Surgeon: Scot Jun, MD;  Location: [facility] ENDOSCOPY;  Service: Endoscopy;  Laterality: N/A;  . ECTOPIC PREGNANCY SURGERY    . MANDIBLE SURGERY    . WISDOM TOOTH EXTRACTION      Family History  Problem Relation Age of Onset  . Bladder Cancer Father   . Prostate cancer Father   . Bone cancer Maternal Aunt 80  . Cancer Maternal Aunt   . Diabetes Mother    . Hyperthyroidism Mother   . Cancer Maternal Grandmother   . Breast cancer Neg Hx     Social History Social History   Tobacco Use  . Smoking status: Never Smoker  . Smokeless tobacco: Never Used  Substance Use Topics  . Alcohol use: No  . Drug use: No    Allergies  Allergen Reactions  . Codeine Nausea Only    Current Outpatient Medications  Medication Sig Dispense Refill  . Ascorbic Acid (VITAMIN C) 1000 MG tablet Take 1,000 mg by mouth daily.     . ASPIRIN 81 PO Take 81 mg by mouth daily.     Marland Kitchen atorvastatin (LIPITOR) 40 MG tablet Take 40 mg by mouth at bedtime.     . Calcium Carbonate-Vit D-Min (CALCIUM 1200 PO) Take 1,200 mg by mouth daily.    . cyanocobalamin 1000 MCG tablet Take 1,000 mcg by mouth daily.    . ergocalciferol (VITAMIN D2) 50000 units capsule Take 50,000 Units by mouth once a week.    . furosemide (LASIX) 20 MG tablet Take 20 mg by mouth daily.     Marland Kitchen glucose blood (ONE TOUCH ULTRA TEST) test strip Use 4 (four) times daily. Use as instructed.    . hydrochlorothiazide (HYDRODIURIL) 25 MG  tablet Take 25 mg by mouth daily.    . Insulin Glargine, 2 Unit Dial, (TOUJEO MAX SOLOSTAR) 300 UNIT/ML SOPN Inject 68 Units into the skin at bedtime.    . insulin lispro (HUMALOG) 100 UNIT/ML injection Inject 12-20 Units into the skin 3 (three) times daily before meals.    . Insulin Pen Needle (FIFTY50 PEN NEEDLES) 31G X 5 MM MISC USE WITH INSULIN    . losartan (COZAAR) 50 MG tablet Take 50 mg by mouth daily.    . Melatonin 3 MG TABS Take 6 mg by mouth at bedtime.     Marland Kitchen nystatin cream (MYCOSTATIN) Apply 1 application topically 2 (two) times daily. PRN (Patient taking differently: Apply 1 application topically 2 (two) times daily as needed for dry skin. ) 30 g 6  . omeprazole (PRILOSEC) 20 MG capsule Take 20 mg by mouth daily.     Bertram Gala Glycol-Propyl Glycol (SYSTANE OP) Place 1 drop into both eyes 2 (two) times daily as needed (dry eyes).    . potassium chloride  (MICRO-K) 10 MEQ CR capsule Take 10 mEq by mouth daily.     No current facility-administered medications for this visit.     Review of Systems Review of Systems  Constitutional: Negative.   Respiratory: Negative.   Cardiovascular: Negative.     Blood pressure 130/78, pulse 75, temperature (!) 97.2 F (36.2 C), temperature source Temporal, height 5\' 8"  (1.727 m), weight 248 lb (112.5 kg), SpO2 98 %.  Physical Exam Physical Exam Exam conducted with a chaperone present.  Constitutional:      Appearance: Normal appearance.  Chest:       Comments: Right breast steri strips  Skin:    General: Skin is warm and dry.  Neurological:     Mental Status: She is alert and oriented to person, place, and time.  Psychiatric:        Mood and Affect: Mood normal.        Behavior: Behavior normal.     Data Reviewed Bone density exam of Jan 24, 2017: Normal.  September 27, 2018 core biopsy: A. 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  - NEGATIVE FOR ATYPIA AND MALIGNANCY.   November 19, 2018 open biopsy: A. BREAST, RIGHT AT 9:00; NEEDLE LOCALIZED EXCISIONAL BIOPSY:  - FOCAL ATYPICAL DUCTAL HYPERPLASIA, MEASURING 0.3 MM, 2 MM FROM  POSTERIOR TISSUE EDGE, ALL SURGICAL MARGINS FREE OF ATYPIA.  - BENIGN INTRADUCTAL PAPILLOMAS AND BACKGROUND FIBROCYSTIC CHANGES.  - CHANGES ASSOCIATED WITH PRIOR BIOPSY SITE.  - NEGATIVE FOR DUCTAL CARCINOMA IN SITU AND MALIGNANCY.   Terry Hickman model risk assessment: 3.8% over 5 years, 14% lifetime.  Candidate for chemoprevention.   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  Plan Pros and cons of chemoprevention to lower the risk of recurrent ADH or the development of frank malignancy were reviewed.  With normal bone density, and uterus in situ, would consider an aromatase inhibitor first.  Side effects primarily slow  bone loss over time and muscle aches.  This might be preferable to tamoxifen with a 1% risk of DVT.  The patient will consider her options and notify if she would like to make use of a 1 month trial.   The patient has been asked to return to the office in December with a bilateral diagnostic mammogram.   The patient is aware to call back for  any questions or new concerns.    HPI, Physical Exam, Assessment and Plan have been scribed under the direction and in the presence of Donnalee Curry, MD.  Terry Hickman, CMA HPI, assessment, plan and physical exam has been scribed under the direction and in the presence of Earline Mayotte, MD. Terry Daft, RN  I have completed the exam and reviewed the above documentation for accuracy and completeness.  I agree with the above.  Museum/gallery conservator has been used and any errors in dictation or transcription are unintentional.  Donnalee Curry, M.D., F.A.C.S.  Terry Hickman 11/27/2018, 10:40 AM

## 2019-04-05 ENCOUNTER — Encounter: Payer: Self-pay | Admitting: General Surgery

## 2019-07-04 ENCOUNTER — Other Ambulatory Visit: Payer: Self-pay

## 2019-07-04 DIAGNOSIS — D241 Benign neoplasm of right breast: Secondary | ICD-10-CM

## 2019-07-10 ENCOUNTER — Telehealth: Payer: Self-pay | Admitting: Obstetrics & Gynecology

## 2019-07-10 DIAGNOSIS — B3731 Acute candidiasis of vulva and vagina: Secondary | ICD-10-CM

## 2019-07-10 DIAGNOSIS — B373 Candidiasis of vulva and vagina: Secondary | ICD-10-CM

## 2019-07-15 ENCOUNTER — Telehealth: Payer: Self-pay

## 2019-07-15 ENCOUNTER — Other Ambulatory Visit: Payer: Self-pay | Admitting: Obstetrics & Gynecology

## 2019-07-15 DIAGNOSIS — B3731 Acute candidiasis of vulva and vagina: Secondary | ICD-10-CM

## 2019-07-15 DIAGNOSIS — B373 Candidiasis of vulva and vagina: Secondary | ICD-10-CM

## 2019-07-15 MED ORDER — NYSTATIN 100000 UNIT/GM EX CREA: 1.0000 "application " | TOPICAL_CREAM | Freq: Two times a day (BID) | CUTANEOUS | 0 refills | Status: DC

## 2019-07-15 NOTE — Telephone Encounter (Signed)
Pt aware.

## 2019-07-15 NOTE — Telephone Encounter (Signed)
Pt calling; needs nystatin approved for refill; has made appt.  [phone]

## 2019-07-15 NOTE — Telephone Encounter (Signed)
done

## 2019-07-15 NOTE — Telephone Encounter (Signed)
Pt needs a refill,no symptoms just she out. please advise

## 2019-07-15 NOTE — Telephone Encounter (Signed)
Schedule annual appt, is due

## 2019-07-15 NOTE — Telephone Encounter (Signed)
Rx done. 

## 2019-07-15 NOTE — Telephone Encounter (Signed)
Patient is schedule for annual with Dr. Kenton Kingfisher on 08/14/19. Patient has questions about her medications. Please advise

## 2019-07-15 NOTE — Telephone Encounter (Signed)
Just a refill or a PA?

## 2019-07-15 NOTE — Telephone Encounter (Signed)
Voicemail box is full. Unable to leave message

## 2019-07-18 ENCOUNTER — Other Ambulatory Visit: Payer: Self-pay

## 2019-07-18 DIAGNOSIS — Z1231 Encounter for screening mammogram for malignant neoplasm of breast: Secondary | ICD-10-CM

## 2019-07-31 ENCOUNTER — Other Ambulatory Visit: Payer: Self-pay | Admitting: General Surgery

## 2019-07-31 DIAGNOSIS — N6099 Unspecified benign mammary dysplasia of unspecified breast: Secondary | ICD-10-CM

## 2019-07-31 NOTE — Progress Notes (Signed)
[id number]  

## 2019-08-03 ENCOUNTER — Other Ambulatory Visit: Payer: Self-pay | Admitting: General Surgery

## 2019-08-03 DIAGNOSIS — N6099 Unspecified benign mammary dysplasia of unspecified breast: Secondary | ICD-10-CM

## 2019-08-14 ENCOUNTER — Ambulatory Visit: Payer: Medicare Other | Admitting: Obstetrics & Gynecology

## 2019-08-16 ENCOUNTER — Other Ambulatory Visit: Payer: Self-pay

## 2019-08-16 DIAGNOSIS — Z20822 Contact with and (suspected) exposure to covid-19: Secondary | ICD-10-CM

## 2019-08-18 LAB — NOVEL CORONAVIRUS, NAA: SARS-CoV-2, NAA: NOT DETECTED

## 2019-08-22 ENCOUNTER — Ambulatory Visit
Admission: RE | Admit: 2019-08-22 | Discharge: 2019-08-22 | Disposition: A | Discharge status: Home / Self Care | Payer: Medicare Other | Source: Ambulatory Visit | Attending: Surgery | Admitting: Surgery

## 2019-08-22 DIAGNOSIS — Z1231 Encounter for screening mammogram for malignant neoplasm of breast: Secondary | ICD-10-CM | POA: Diagnosis present

## 2019-08-26 ENCOUNTER — Telehealth: Payer: Self-pay

## 2019-08-26 ENCOUNTER — Ambulatory Visit: Payer: Medicare Other | Admitting: Surgery

## 2019-08-26 NOTE — Telephone Encounter (Signed)
Left detailed message for patient notifying her of her results mammogram results being normal per Dr.Pabon. Advised patient to give our office a call if she has any questions or concerns.

## 2019-08-26 NOTE — Telephone Encounter (Signed)
-----   Message from Jules Husbands, MD sent at 08/26/2019  9:02 AM EST ----- Please let her know her mammo was nml ----- Message ----- From: Interface, Rad Results In Sent: 08/23/2019   3:42 PM EST To: Jules Husbands, MD

## 2019-09-02 ENCOUNTER — Other Ambulatory Visit: Payer: Self-pay | Admitting: Obstetrics & Gynecology

## 2019-09-12 ENCOUNTER — Ambulatory Visit: Payer: Medicare Other | Admitting: Obstetrics & Gynecology

## 2019-10-02 ENCOUNTER — Ambulatory Visit: Payer: Medicare Other | Admitting: Obstetrics & Gynecology

## 2019-10-24 ENCOUNTER — Ambulatory Visit: Payer: Medicare Other | Attending: Neurology

## 2019-10-24 DIAGNOSIS — G4733 Obstructive sleep apnea (adult) (pediatric): Secondary | ICD-10-CM | POA: Insufficient documentation

## 2019-10-24 DIAGNOSIS — G4761 Periodic limb movement disorder: Secondary | ICD-10-CM | POA: Insufficient documentation

## 2019-10-24 DIAGNOSIS — G2581 Restless legs syndrome: Secondary | ICD-10-CM | POA: Diagnosis not present

## 2019-10-25 ENCOUNTER — Other Ambulatory Visit: Payer: Self-pay

## 2019-10-30 ENCOUNTER — Ambulatory Visit: Payer: Medicare Other | Admitting: Obstetrics & Gynecology

## 2019-11-21 ENCOUNTER — Telehealth: Payer: Self-pay | Admitting: Obstetrics & Gynecology

## 2019-11-21 ENCOUNTER — Encounter: Payer: Self-pay | Admitting: Obstetrics & Gynecology

## 2019-11-21 ENCOUNTER — Other Ambulatory Visit: Payer: Self-pay

## 2019-11-21 ENCOUNTER — Ambulatory Visit (INDEPENDENT_AMBULATORY_CARE_PROVIDER_SITE_OTHER): Payer: Medicare Other | Admitting: Obstetrics & Gynecology

## 2019-11-21 VITALS — BP 130/80 | Ht 68.0 in | Wt 254.0 lb

## 2019-11-21 DIAGNOSIS — M8580 Other specified disorders of bone density and structure, unspecified site: Secondary | ICD-10-CM

## 2019-11-21 DIAGNOSIS — Z1239 Encounter for other screening for malignant neoplasm of breast: Secondary | ICD-10-CM

## 2019-11-21 DIAGNOSIS — Z01419 Encounter for gynecological examination (general) (routine) without abnormal findings: Secondary | ICD-10-CM | POA: Diagnosis not present

## 2019-11-21 DIAGNOSIS — M8588 Other specified disorders of bone density and structure, other site: Secondary | ICD-10-CM

## 2019-11-21 DIAGNOSIS — Z13820 Encounter for screening for osteoporosis: Secondary | ICD-10-CM

## 2019-11-21 DIAGNOSIS — Z90710 Acquired absence of both cervix and uterus: Secondary | ICD-10-CM

## 2019-11-21 NOTE — Telephone Encounter (Signed)
Patient aware of her appt time and date at [facility] for her Dexa scan on thurs 11/28/2019 @ 9:00am. Patient is aware no calcium the morning before her scan.

## 2019-11-21 NOTE — Patient Instructions (Signed)
PAP every 5 years Mammogram every year Colonoscopy every 10 years Labs yearly (with PCP)   Bone Density Test The bone density test uses a special type of X-ray to measure the amount of calcium and other minerals in your bones. It can measure bone density in the hip and the spine. The test procedure is similar to having a regular X-ray. This test may also be called:  Bone densitometry.  Bone mineral density test.  Dual-energy X-ray absorptiometry (DEXA). You may have this test to:  Diagnose a condition that causes weak or thin bones (osteoporosis).  Screen you for osteoporosis.  Predict your risk for a broken bone (fracture).  Determine how well your osteoporosis treatment is working. Tell a health care provider about:  Any allergies you have.  All medicines you are taking, including vitamins, herbs, eye drops, creams, and over-the-counter medicines.  Any problems you or family members have had with anesthetic medicines.  Any blood disorders you have.  Any surgeries you have had.  Any medical conditions you have.  Whether you are pregnant or may be pregnant.  Any medical tests you have had within the past 14 days that used contrast material. What are the risks? Generally, this is a safe procedure. However, it does expose you to a small amount of radiation, which can slightly increase your cancer risk. What happens before the procedure?  Do not take any calcium supplements starting 24 hours before your test.  Remove all metal jewelry, eyeglasses, dental appliances, and any other metal objects. What happens during the procedure?   You will lie down on an exam table. There will be an X-ray generator below you and an imaging device above you.  Other devices, such as boxes or braces, may be used to position your body properly for the scan.  The machine will slowly scan your body. You will need to keep still.  The images will show up on a screen in the room. Images  will be examined by a specialist after your test is done. The procedure may vary among health care providers and hospitals. What happens after the procedure?  It is up to you to get your test results. Ask your health care provider, or the department that is doing the test, when your results will be ready. Summary  A bone density test is an imaging test that uses a type of X-ray to measure the amount of calcium and other minerals in your bones.  The test may be used to diagnose or screen you for a condition that causes weak or thin bones (osteoporosis), predict your risk for a broken bone (fracture), or determine how well your osteoporosis treatment is working.  Do not take any calcium supplements starting 24 hours before your test.  Ask your health care provider, or the department that is doing the test, when your results will be ready. This information is not intended to replace advice given to you by your health care provider. Make sure you discuss any questions you have with your health care provider. Document Revised: 09/14/2017 Document Reviewed: 07/03/2017 Elsevier Patient Education  [place].

## 2019-11-21 NOTE — Progress Notes (Signed)
HPI:      [name] is a 64 y.o. G1P0010 who LMP was in the past, she presents today for her annual examination.  The patient has no complaints today. The patient is sexually active. Herlast pap: approximate date 2017 and was normal and last mammogram: approximate date 08/2019 and was normal.  The patient does perform self breast exams.  There is notable family history of breast or ovarian cancer in her family. The patient is not taking hormone replacement therapy. Patient denies post-menopausal vaginal bleeding.   The patient has regular exercise: yes. The patient denies current symptoms of depression.    GYN Hx: Last Colonoscopy:4 years ago. Normal.  Last DEXA: 3 years ago.    PMHx: Past Medical History:  Diagnosis Date  . Anemia   . DDD (degenerative disc disease), cervical   . Diabetes mellitus without complication ([place])   . Duodenal ulcer   . Family history of adverse reaction to anesthesia    mother = PONV  . Fibrocystic breast disease   . Gall stone   . GERD (gastroesophageal reflux disease)   . Herpes zoster   . Hypercholesterolemia   . Hypertension   . Hyperthyroidism   . PONV (postoperative nausea and vomiting)   . Shingles   . 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 Sleep apnea   . Vitamin B 12 deficiency   . Vitamin D deficiency    Past Surgical History:  Procedure Laterality Date  . ABDOMINAL HYSTERECTOMY    . BREAST BIOPSY Right 09/13/2018   Papilloma, [place] bx  . BREAST BIOPSY Right 09/13/2018   Papilloma, [place] bx  . BREAST BIOPSY Right 09/27/2018   affirm bx of mass, path pending x marker  . BREAST BIOPSY Right 11/19/2018   Procedure: EXCISION RIGHT BREAST PAPILLOMA WITH NEEDLE LOCALIZATION X2 10:30 START PER MAMMO;  Surgeon: Ginelle Bays Bellow, MD;  Location: [facility] ORS;  Service: General;  Laterality: Right;  . BREAST EXCISIONAL BIOPSY Right 09/12/1990   negative  . BREAST LUMPECTOMY Right 11/19/2018   papilloma  . COLONOSCOPY WITH PROPOFOL N/A  07/06/2016   Procedure: COLONOSCOPY WITH PROPOFOL;  Surgeon: Manya Silvas, MD;  Location: [facility] ENDOSCOPY;  Service: Endoscopy;  Laterality: N/A;  . ECTOPIC PREGNANCY SURGERY    . HAND SURGERY    . MANDIBLE SURGERY    . WISDOM TOOTH EXTRACTION     Family History  Problem Relation Age of Onset  . Bladder Cancer Father   . Prostate cancer Father   . Bone cancer Maternal Aunt 80  . Cancer Maternal Aunt   . Diabetes Mother   . Hyperthyroidism Mother   . Cancer Maternal Grandmother   . Breast cancer Neg Hx    Social History   Tobacco Use  . Smoking status: Never Smoker  . Smokeless tobacco: Never Used  Substance Use Topics  . Alcohol use: No  . Drug use: No    Current Outpatient Medications:  .  Ascorbic Acid (VITAMIN C) 1000 MG tablet, Take 1,000 mg by mouth daily. , Disp: , Rfl:  .  ASPIRIN 81 PO, Take 81 mg by mouth daily. , Disp: , Rfl:  .  atorvastatin (LIPITOR) 40 MG tablet, Take 40 mg by mouth at bedtime. , Disp: , Rfl:  .  Calcium Carbonate-Vit D-Min (CALCIUM 1200 PO), Take 1,200 mg by mouth daily., Disp: , Rfl:  .  cyanocobalamin 1000 MCG tablet, Take 1,000 mcg by mouth daily., Disp: , Rfl:  .  ergocalciferol (VITAMIN D2) 50000 units capsule, Take 50,000 Units by mouth once a week., Disp: , Rfl:  .  furosemide (LASIX) 20 MG tablet, Take 20 mg by mouth daily. , Disp: , Rfl:  .  glucose blood (ONE TOUCH ULTRA TEST) test strip, Use 4 (four) times daily. Use as instructed., Disp: , Rfl:  .  hydrochlorothiazide (HYDRODIURIL) 25 MG tablet, Take 25 mg by mouth daily., Disp: , Rfl:  .  Insulin Glargine, 2 Unit Dial, (TOUJEO MAX SOLOSTAR) 300 UNIT/ML SOPN, Inject 68 Units into the skin at bedtime., Disp: , Rfl:  .  insulin lispro (HUMALOG) 100 UNIT/ML injection, Inject 12-20 Units into the skin 3 (three) times daily before meals., Disp: , Rfl:  .  Insulin Pen Needle (FIFTY50 PEN NEEDLES) 31G X 5 MM MISC, USE WITH INSULIN, Disp: , Rfl:  .  losartan (COZAAR) 50 MG tablet, Take 50  mg by mouth daily., Disp: , Rfl:  .  Melatonin 3 MG TABS, Take 6 mg by mouth at bedtime. , Disp: , Rfl:  .  nystatin cream (MYCOSTATIN), Apply 1 application topically 2 (two) times daily. PRN, Disp: 30 g, Rfl: 0 .  omeprazole (PRILOSEC) 20 MG capsule, Take 20 mg by mouth daily. , Disp: , Rfl:  .  Polyethyl Glycol-Propyl Glycol (SYSTANE OP), Place 1 drop into both eyes 2 (two) times daily as needed (dry eyes)., Disp: , Rfl:  .  potassium chloride (MICRO-K) 10 MEQ CR capsule, Take 10 mEq by mouth daily., Disp: , Rfl:  Allergies: Codeine  Review of Systems  Constitutional: Negative for chills, fever and malaise/fatigue.  HENT: Negative for congestion, sinus pain and sore throat.   Eyes: Negative for blurred vision and pain.  Respiratory: Negative for cough and wheezing.   Cardiovascular: Negative for chest pain and leg swelling.  Gastrointestinal: Negative for abdominal pain, constipation, diarrhea, heartburn, nausea and vomiting.  Genitourinary: Negative for dysuria, frequency, hematuria and urgency.  Musculoskeletal: Negative for back pain, joint pain, myalgias and neck pain.  Skin: Negative for itching and rash.  Neurological: Negative for dizziness, tremors and weakness.  Endo/Heme/Allergies: Does not bruise/bleed easily.  Psychiatric/Behavioral: Negative for depression. The patient is not nervous/anxious and does not have insomnia.     Objective: BP 130/80   Ht 5\' 8"  (1.727 m)   Wt 254 lb (115.2 kg)   BMI 38.62 kg/m   Filed Weights   11/21/19 1041  Weight: 254 lb (115.2 kg)   Body mass index is 38.62 kg/m. Physical Exam Constitutional:      General: She is not in acute distress.    Appearance: She is well-developed.  Genitourinary:     Pelvic exam was performed with patient supine.     Vagina and rectum normal.     No lesions in the vagina.     No vaginal bleeding.     No right or left adnexal mass present.     Right adnexa not tender.     Left adnexa not tender.      Genitourinary Comments: Absent Uterus Absent cervix Vaginal cuff well healed  HENT:     Head: Normocephalic and atraumatic. No laceration.     Right Ear: Hearing normal.     Left Ear: Hearing normal.     Mouth/Throat:     Pharynx: Uvula midline.  Eyes:     Pupils: Pupils are equal, round, and reactive to light.  Neck:     Thyroid: No thyromegaly.  Cardiovascular:     Rate  and Rhythm: Normal rate and regular rhythm.     Heart sounds: No murmur. No friction rub. No gallop.   Pulmonary:     Effort: Pulmonary effort is normal. No respiratory distress.     Breath sounds: Normal breath sounds. No wheezing.  Chest:     Breasts:        Right: No mass, skin change or tenderness.        Left: No mass, skin change or tenderness.  Abdominal:     General: Bowel sounds are normal. There is no distension.     Palpations: Abdomen is soft.     Tenderness: There is no abdominal tenderness. There is no rebound.  Musculoskeletal:        General: Normal range of motion.     Cervical back: Normal range of motion and neck supple.  Neurological:     Mental Status: She is alert and oriented to person, place, and time.     Cranial Nerves: No cranial nerve deficit.  Skin:    General: Skin is warm and dry.  Psychiatric:        Judgment: Judgment normal.  Vitals reviewed.     Assessment: Annual Exam 1. Women's annual routine gynecological examination   2. Encounter for breast cancer screening using non-mammogram modality   3. Osteopenia, unspecified location   4. Screening for osteoporosis   5. Other specified disorders of bone density and structure, other site      Plan:            1.  Vaginal Screening-  Pap smear schedule reviewed with patient, due 2022 (post hyst PAP)  2. Breast screening- Exam annually and mammogram scheduled  3. Colonoscopy every 10 years, Hemoccult testing after age 100  4. Labs managed by PCP  5. Counseling for hormonal therapy: none              6. FRAX - FRAX  score for assessing the 10 year probability for fracture calculated and discussed today.  Based on age and score today, DEXA is scheduled. Prior Osteopenia 2018 DEXA and one prior (-1.6 last DEXA) Increase calcium to 1500 mg daily, also include Vit D and exercise    F/U  Return in about 1 year (around 11/20/2020) for Annual.  Barnett Applebaum, MD, [facility], [facility] 11/21/2019  11:11 AM

## 2019-11-28 ENCOUNTER — Other Ambulatory Visit: Payer: Medicare Other

## 2019-12-11 ENCOUNTER — Other Ambulatory Visit: Payer: Medicare Other

## 2020-02-19 ENCOUNTER — Ambulatory Visit: Payer: Medicare Other | Admitting: Obstetrics and Gynecology

## 2020-02-20 ENCOUNTER — Ambulatory Visit (INDEPENDENT_AMBULATORY_CARE_PROVIDER_SITE_OTHER): Payer: Medicare Other | Admitting: Obstetrics and Gynecology

## 2020-02-20 ENCOUNTER — Encounter: Payer: Self-pay | Admitting: Obstetrics and Gynecology

## 2020-02-20 ENCOUNTER — Other Ambulatory Visit: Payer: Self-pay

## 2020-02-20 VITALS — BP 120/70 | Ht 68.0 in | Wt 234.0 lb

## 2020-02-20 DIAGNOSIS — N898 Other specified noninflammatory disorders of vagina: Secondary | ICD-10-CM | POA: Diagnosis not present

## 2020-02-20 DIAGNOSIS — N764 Abscess of vulva: Secondary | ICD-10-CM

## 2020-02-20 LAB — POCT WET PREP WITH KOH
Clue Cells Wet Prep HPF POC: NEGATIVE
KOH Prep POC: NEGATIVE
Trichomonas, UA: NEGATIVE
Yeast Wet Prep HPF POC: NEGATIVE

## 2020-02-20 MED ORDER — NYSTATIN 100000 UNIT/GM EX CREA: 1.0000 "application " | TOPICAL_CREAM | Freq: Two times a day (BID) | CUTANEOUS | 0 refills | Status: DC

## 2020-02-20 MED ORDER — DOXYCYCLINE HYCLATE 100 MG PO CAPS: 100.0000 mg | ORAL_CAPSULE | Freq: Two times a day (BID) | ORAL | 0 refills | Status: AC

## 2020-02-20 NOTE — Patient Instructions (Signed)
I value your feedback and entrusting us with your care. If you get a [facility] patient survey, I would appreciate you taking the time to let us know about your experience today. Thank you!  As of August 22, 2019, your lab results will be released to your MyChart immediately, before I even have a chance to see them. Please give me time to review them and contact you if there are any abnormalities. Thank you for your patience.  

## 2020-02-20 NOTE — Progress Notes (Signed)
Kirk Ruths, MD   Chief Complaint  Patient presents with  . Vaginal Exam    lump/knot x 2 weeks, painful    HPI:      Terry Hickman is a 64 y.o. G1P0010 whose LMP was No LMP recorded. Patient has had a hysterectomy., presents today for painful pimple LT labia majora for a couple wks. Looks "open" now per pt report although hasn't noticed any drainage. No fevers. Hx of vaginal "pimples" in past that resolved but this one is persisting. Has treated with pimple crm without relief. No warm compresses/sitz baths. Not shaving. 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 Pt also with vaginal itching at introitus, no d/c. Treats with nystatin in past when it occurs with sx relief. Needs Rx RF. Hx of DM. Uses scented soap and dryer sheets.   Past Medical History:  Diagnosis Date  . Anemia   . DDD (degenerative disc disease), cervical   . Diabetes mellitus without complication ([place])   . Duodenal ulcer   . Family history of adverse reaction to anesthesia    mother = PONV  . Fibrocystic breast disease   . Gall stone   . GERD (gastroesophageal reflux disease)   . Herpes zoster   . Hypercholesterolemia   . Hypertension   . Hyperthyroidism   . PONV (postoperative nausea and vomiting)   . Shingles   . 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 Sleep apnea   . Vitamin B 12 deficiency   . Vitamin D deficiency     Past Surgical History:  Procedure Laterality Date  . ABDOMINAL HYSTERECTOMY    . BREAST BIOPSY Right 09/13/2018   Papilloma, [place] bx  . BREAST BIOPSY Right 09/13/2018   Papilloma, [place] bx  . BREAST BIOPSY Right 09/27/2018   affirm bx of mass, path pending x marker  . BREAST BIOPSY Right 11/19/2018   Procedure: EXCISION RIGHT BREAST PAPILLOMA WITH NEEDLE LOCALIZATION X2 10:30 START PER MAMMO;  Surgeon: Robert Bellow, MD;  Location: [facility] ORS;  Service: General;  Laterality: Right;  . BREAST EXCISIONAL BIOPSY Right 09/12/1990    negative  . BREAST LUMPECTOMY Right 11/19/2018   papilloma  . COLONOSCOPY WITH PROPOFOL N/A 07/06/2016   Procedure: COLONOSCOPY WITH PROPOFOL;  Surgeon: Manya Silvas, MD;  Location: [facility] ENDOSCOPY;  Service: Endoscopy;  Laterality: N/A;  . ECTOPIC PREGNANCY SURGERY    . HAND SURGERY    . MANDIBLE SURGERY    . WISDOM TOOTH EXTRACTION      Family History  Problem Relation Age of Onset  . Bladder Cancer Father   . Prostate cancer Father   . Bone cancer Maternal Aunt 80  . Cancer Maternal Aunt   . Diabetes Mother   . Hyperthyroidism Mother   . Cancer Maternal Grandmother   . Breast cancer Neg Hx     Social History   Socioeconomic History  . Marital status: Married    Spouse name: Not on file  . Number of children: Not on file  . Years of education: Not on file  . Highest education level: Not on file  Occupational History  . Not on file  Tobacco Use  . Smoking status: Never Smoker  . Smokeless tobacco: Never Used  Vaping Use  . Vaping Use: Never used  Substance and Sexual Activity  . Alcohol use: No  . Drug use: No  . Sexual activity: Yes  Other Topics  Concern  . Not on file  Social History Narrative  . Not on file   Social Determinants of Health   Financial Resource Strain:   . Difficulty of Paying Living Expenses:   Food Insecurity:   . Worried About Charity fundraiser in the Last Year:   . Arboriculturist in the Last Year:   Transportation Needs:   . Film/video editor (Medical):   Marland Kitchen Lack of Transportation (Non-Medical):   Physical Activity:   . Days of Exercise per Week:   . Minutes of Exercise per Session:   Stress:   . Feeling of Stress :   Social Connections:   . Frequency of Communication with Friends and Family:   . Frequency of Social Gatherings with Friends and Family:   . Attends Religious Services:   . Active Member of Clubs or Organizations:   . Attends Archivist Meetings:   Marland Kitchen Marital Status:   Intimate Partner  Violence:   . Fear of Current or Ex-Partner:   . Emotionally Abused:   Marland Kitchen Physically Abused:   . Sexually Abused:     Outpatient Medications Prior to Visit  Medication Sig Dispense Refill  . Ascorbic Acid (VITAMIN C) 1000 MG tablet Take 1,000 mg by mouth daily.     . ASPIRIN 81 PO Take 81 mg by mouth daily.     Marland Kitchen atorvastatin (LIPITOR) 40 MG tablet Take 40 mg by mouth at bedtime.     . Calcium Carbonate-Vit D-Min (CALCIUM 1200 PO) Take 1,200 mg by mouth daily.    . cyanocobalamin 1000 MCG tablet Take 1,000 mcg by mouth daily.    . ergocalciferol (VITAMIN D2) 50000 units capsule Take 50,000 Units by mouth once a week.    Marland Kitchen glucose blood (ONE TOUCH ULTRA TEST) test strip Use 4 (four) times daily. Use as instructed.    . Insulin Glargine, 2 Unit Dial, (TOUJEO MAX SOLOSTAR) 300 UNIT/ML SOPN Inject 68 Units into the skin at bedtime.    . insulin lispro (HUMALOG) 100 UNIT/ML injection Inject 12-20 Units into the skin 3 (three) times daily before meals.    . Insulin Pen Needle (FIFTY50 PEN NEEDLES) 31G X 5 MM MISC USE WITH INSULIN    . losartan (COZAAR) 50 MG tablet Take 50 mg by mouth daily.    . Melatonin 3 MG TABS Take 6 mg by mouth at bedtime.     Marland Kitchen omeprazole (PRILOSEC) 20 MG capsule Take 20 mg by mouth daily.     Vladimir Faster Glycol-Propyl Glycol (SYSTANE OP) Place 1 drop into both eyes 2 (two) times daily as needed (dry eyes).    . nystatin cream (MYCOSTATIN) Apply 1 application topically 2 (two) times daily. PRN 30 g 0  . furosemide (LASIX) 20 MG tablet Take 20 mg by mouth daily.     . hydrochlorothiazide (HYDRODIURIL) 25 MG tablet Take 25 mg by mouth daily.    . potassium chloride (MICRO-K) 10 MEQ CR capsule Take 10 mEq by mouth daily.     No facility-administered medications prior to visit.      ROS:  Review of Systems  Constitutional: Negative for fever.  Gastrointestinal: Negative for blood in stool, constipation, diarrhea, nausea and vomiting.  Genitourinary: Positive for  genital sores. Negative for dyspareunia, dysuria, flank pain, frequency, hematuria, urgency, vaginal bleeding, vaginal discharge and vaginal pain.  Musculoskeletal: Negative for back pain.  Skin: Negative for rash.   BREAST: No symptoms   OBJECTIVE:  Vitals:  BP 120/70   Ht 5\' 8"  (1.727 m)   Wt 234 lb (106.1 kg)   BMI 35.58 kg/m   Physical Exam Vitals reviewed.  Constitutional:      Appearance: She is well-developed.  Pulmonary:     Effort: Pulmonary effort is normal.  Genitourinary:    Pubic Area: No rash.      Labia:        Right: No rash, tenderness or lesion.        Left: Lesion present. No rash or tenderness.      Vagina: Normal. No vaginal discharge, erythema or tenderness.     Cervix: Normal.     Uterus: Absent. Not enlarged and not tender.      Adnexa: Right adnexa normal and left adnexa normal.       Right: No mass or tenderness.         Left: No mass or tenderness.      Musculoskeletal:        General: Normal range of motion.     Cervical back: Normal range of motion.  Skin:    General: Skin is warm and dry.  Neurological:     General: No focal deficit present.     Mental Status: She is alert and oriented to person, place, and time.  Psychiatric:        Mood and Affect: Mood normal.        Behavior: Behavior normal.        Thought Content: Thought content normal.        Judgment: Judgment normal.     Results: Results for orders placed or performed in visit on 02/20/20 (from the past 24 hour(s))  POCT Wet Prep with KOH     Status: Normal   Collection Time: 02/20/20 10:42 AM  Result Value Ref Range   Trichomonas, UA Negative    Clue Cells Wet Prep HPF POC neg    Epithelial Wet Prep HPF POC     Yeast Wet Prep HPF POC neg    Bacteria Wet Prep HPF POC     RBC Wet Prep HPF POC     WBC Wet Prep HPF POC     KOH Prep POC Negative Negative     Assessment/Plan: Vulvar abscess - Plan: doxycycline (VIBRAMYCIN) 100 MG capsule; LT labia majora. Nothing  to I&D. Rx doxy, sitz baths. F/u prn.   Vaginal itching - Plan: POCT Wet Prep with KOH, nystatin cream (MYCOSTATIN); neg wet prep/exam. Pos sx. Rx RF nystatin crm. Change to dove sens skin soap, line dry underwear. F/u prn.   Meds ordered this encounter  Medications  . doxycycline (VIBRAMYCIN) 100 MG capsule    Sig: Take 1 capsule (100 mg total) by mouth 2 (two) times daily for 7 days.    Dispense:  14 capsule    Refill:  0    Order Specific Question:   Supervising Provider    Answer:   Gae Dry [id number]  . nystatin cream (MYCOSTATIN)    Sig: Apply 1 application topically 2 (two) times daily. PRN    Dispense:  30 g    Refill:  0    Order Specific Question:   Supervising Provider    Answer:   Gae Dry [[id number]]      Return if symptoms worsen or fail to improve.  Cambry Spampinato B. Aluel Schwarz, PA-C 02/20/2020 10:43 AM

## 2020-04-04 IMAGING — MG MM PLC BREAST LOC DEV 1ST LESION INC*R*
6 series · 6 of 6 positions shown · non-contrast
Comparison: Previous exams.

CLINICAL DATA: Biopsy-proven high risk papillomas involving the
OUTER RIGHT breast at the 9 o'clock location in the 9:30 o'clock
location (associated with heart shaped and Shy tissue marker
clips).

EXAM:
NEEDLE LOCALIZATION OF THE RIGHT BREAST WITH MAMMO GUIDANCE x 2

[R LM (1 of 3)]
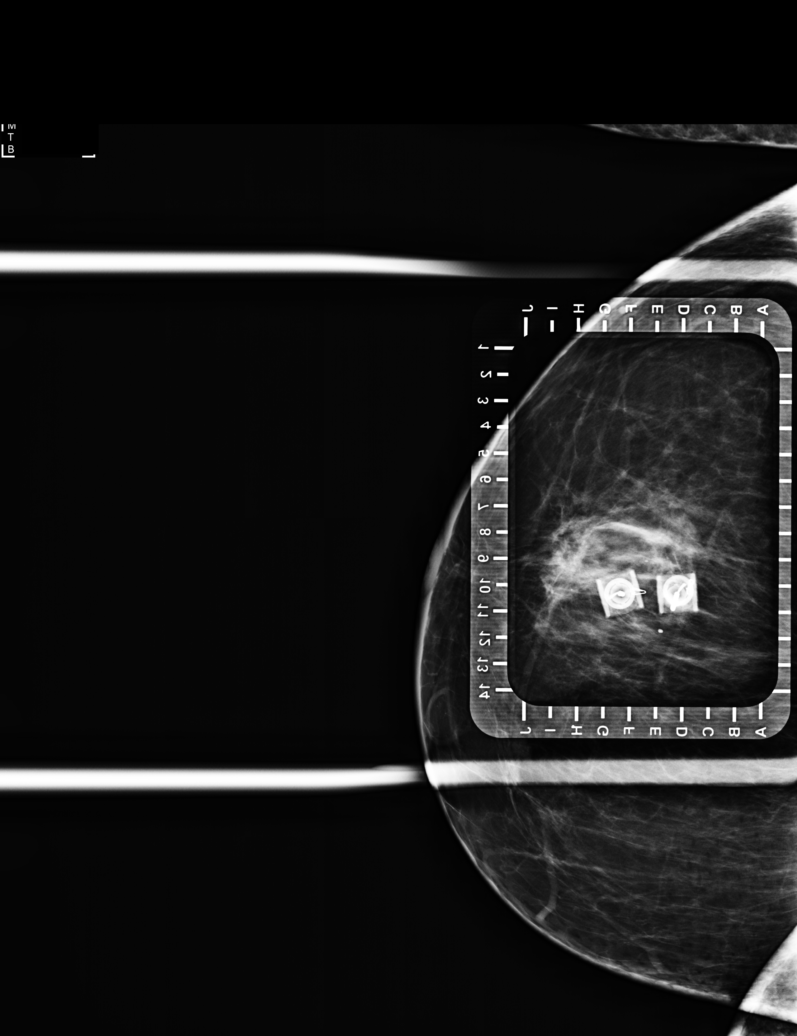

[R CC (1 of 3)]
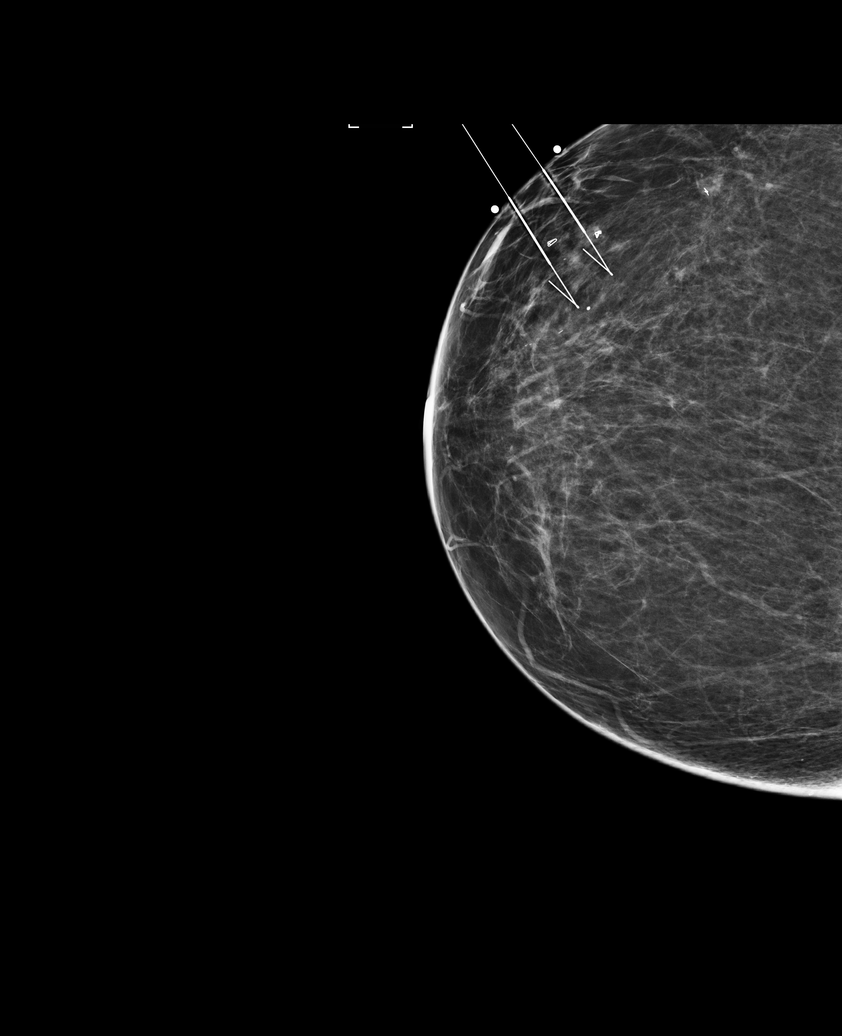

[R CC (2 of 3)]
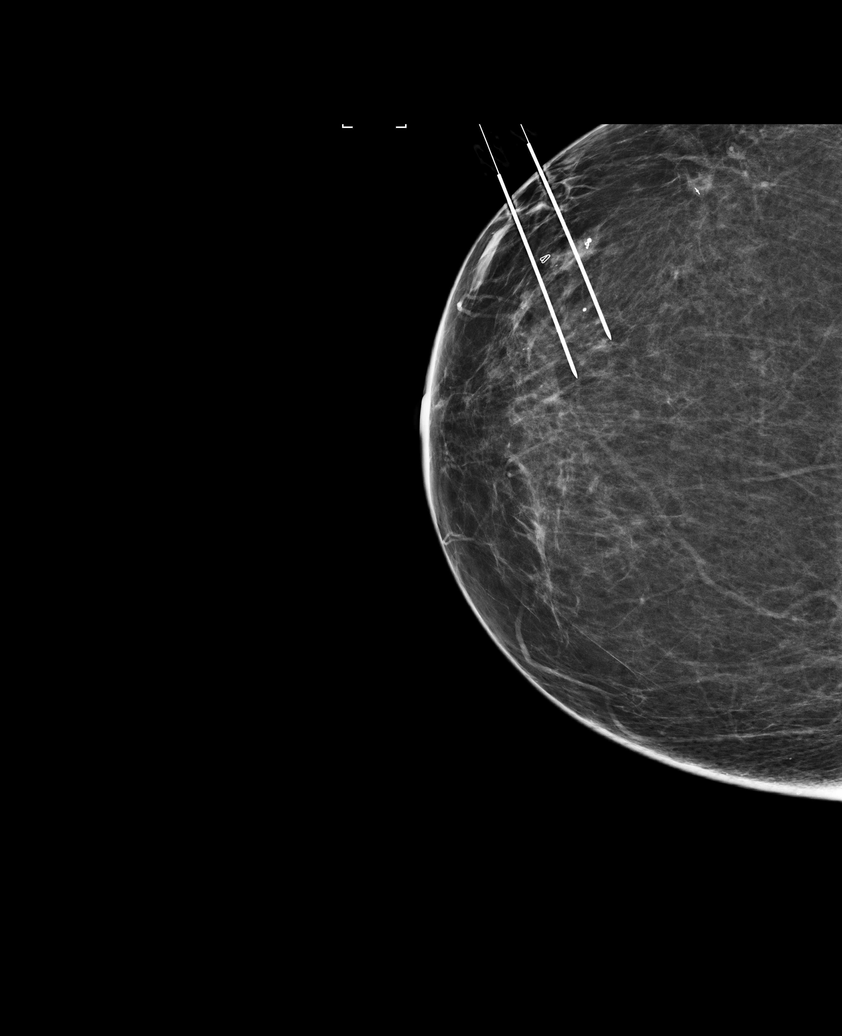

[R LM (2 of 3)]
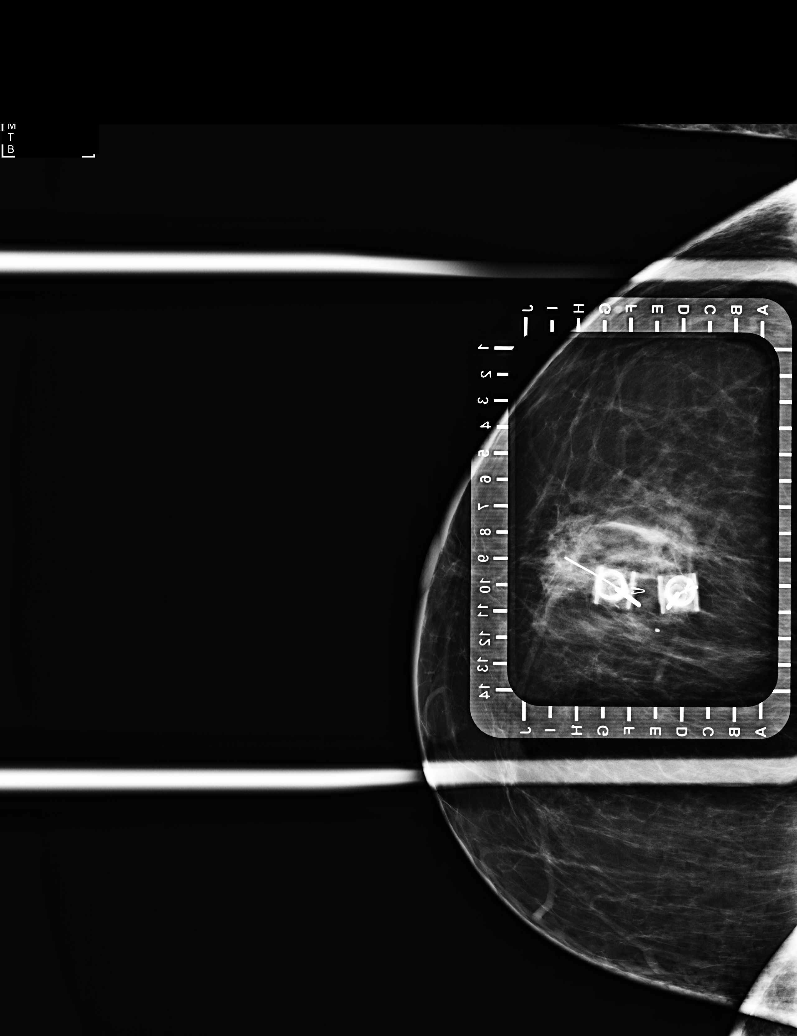

[R LM (3 of 3)]
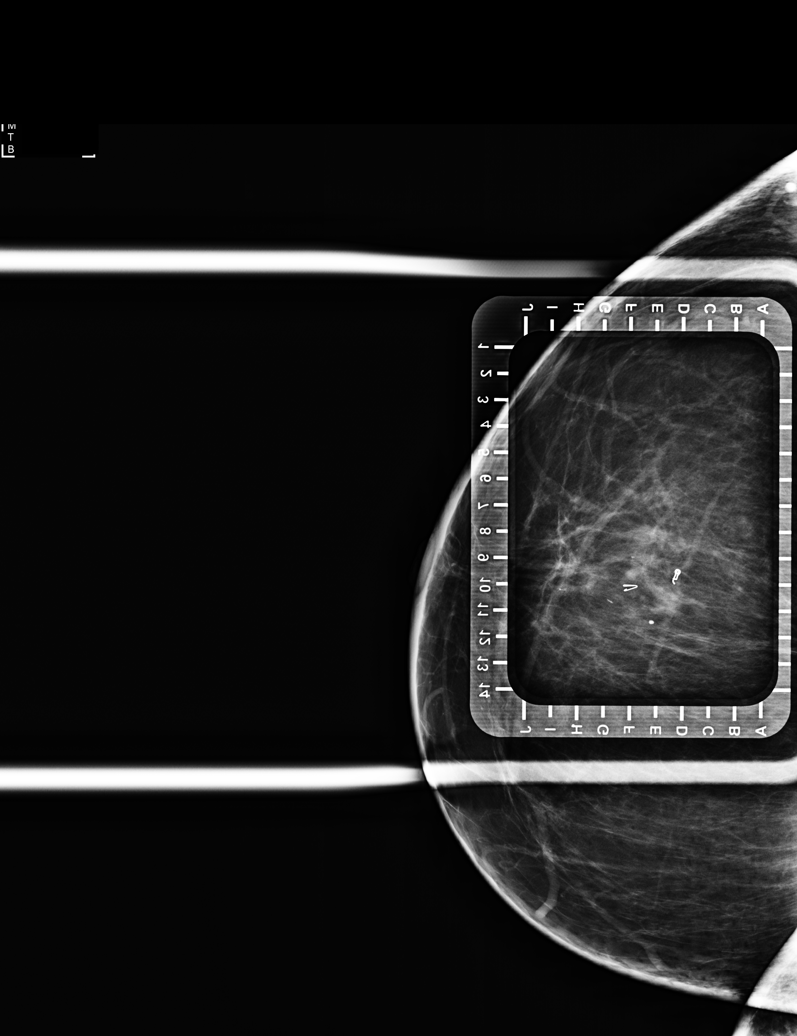

[R CC (3 of 3)]
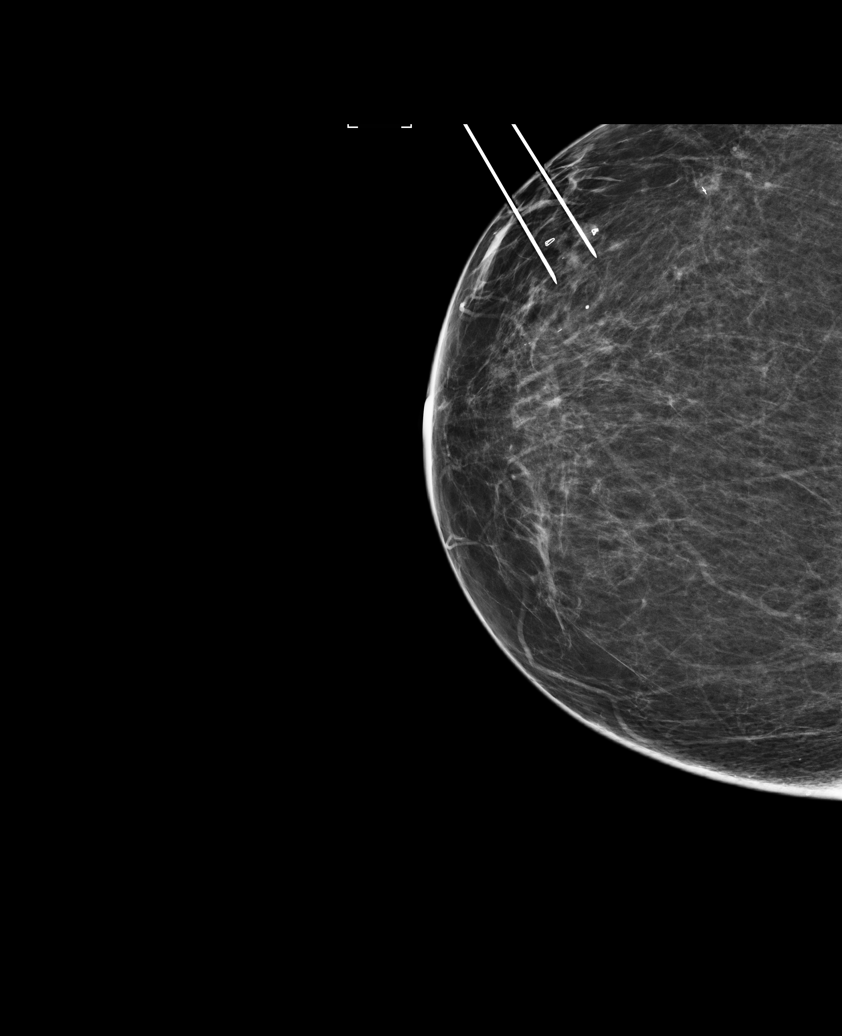

[6 of 6 positions shown; findings below may reference images not displayed]

FINDINGS: Patient presents for needle localization prior to excisional biopsy
of adjacent high risk papillomas involving the OUTER RIGHT breast. I
met with the patient and we discussed the procedure of needle
localization including benefits and alternatives. We discussed the
high likelihood of a successful procedure. We discussed the risks of
the procedure, including infection, bleeding, tissue injury, and
further surgery. Informed, written consent was given. The usual
time-out protocol was performed immediately prior to the procedure.

Using mammographic guidance, sterile technique with chlorhexidine as
skin antisepsis, 1% lidocaine as local anesthetic, 2 separate 5 cm
modified Kopans needle were used to initially localize the heart
shaped tissue marker clip and subsequently the Shy tissue marker
clip associated with the adjacent biopsy-proven papillomas using a
LATERAL approach. The images were marked for Dr. Fustos.
IMPRESSION: Needle localization of adjacent biopsy-proven high risk papillomas
involving the OUTER RIGHT breast. No apparent complications.

## 2020-04-04 IMAGING — MG BREAST SURGICAL SPECIMEN
1 series · 1 of 1 positions shown · non-contrast
Comparison: Previous exam(s).

CLINICAL DATA: Excisional biopsy of adjacent high risk papillomas
from the OUTER RIGHT breast. Needle/wire localization was performed
earlier today.

EXAM:
SPECIMEN RADIOGRAPH OF THE RIGHT BREAST

[R SPECIMEN]
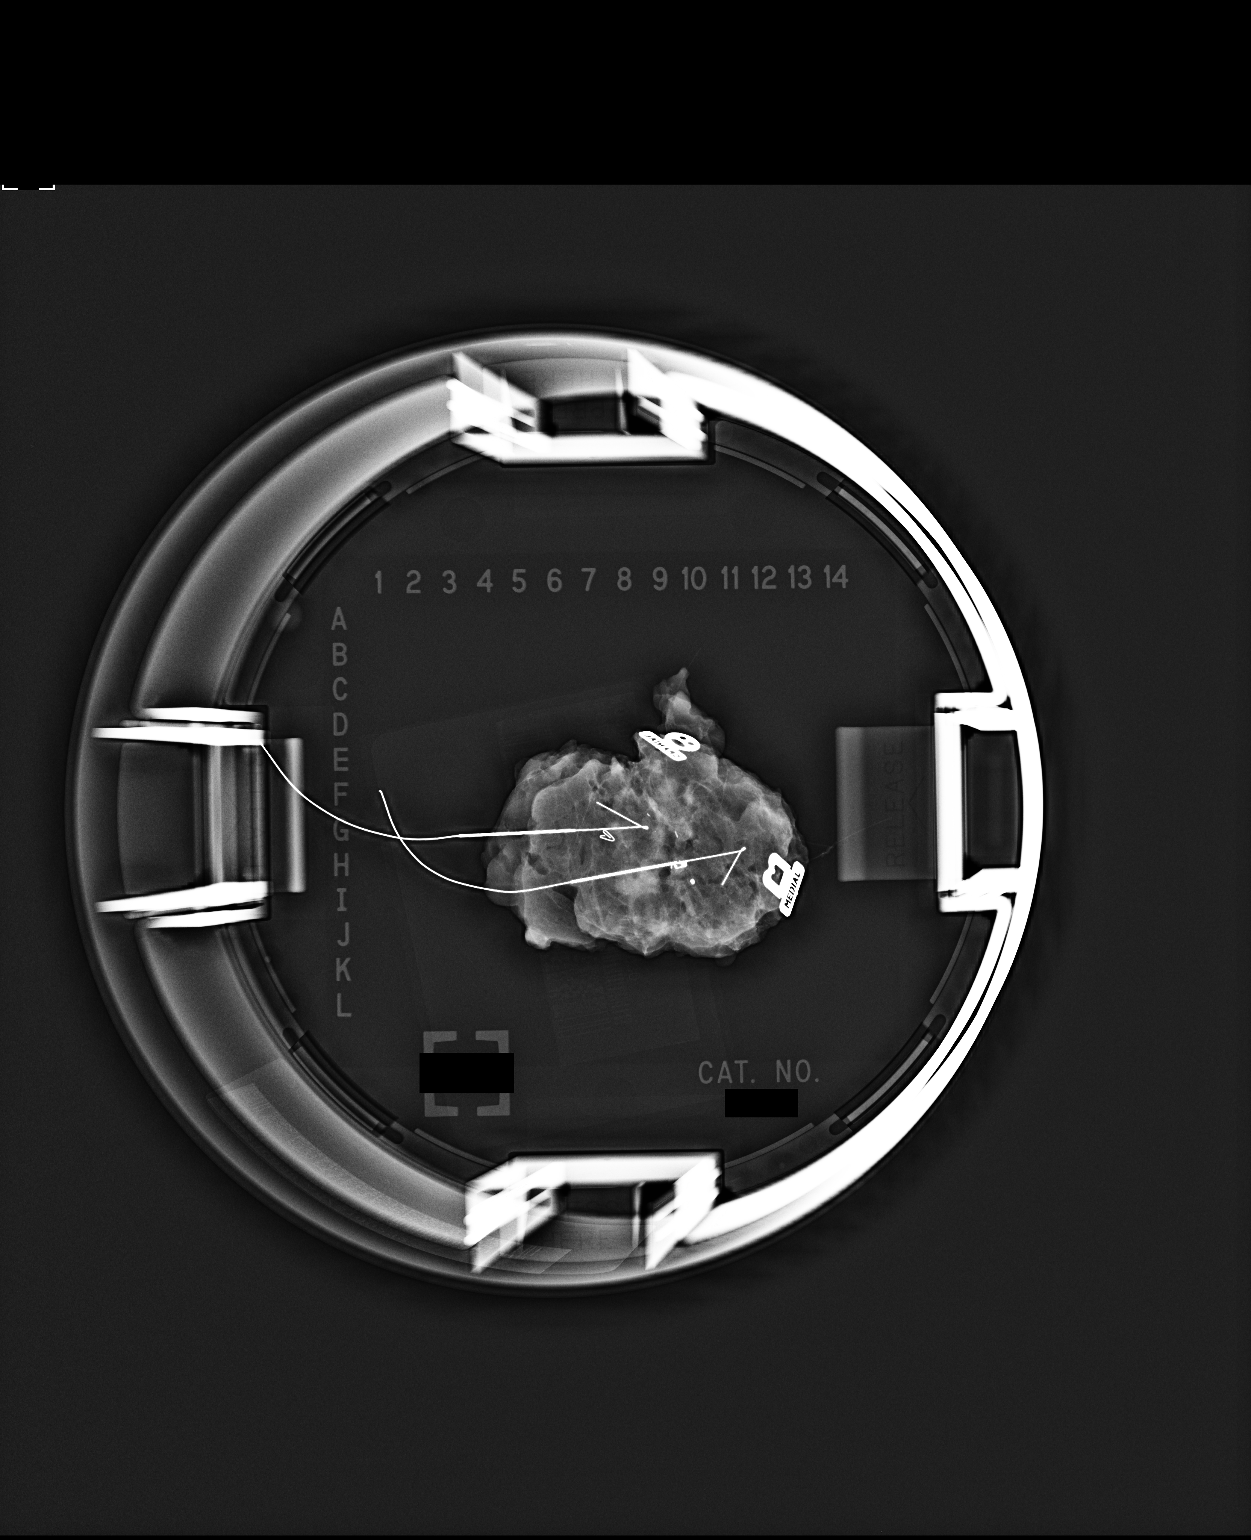

[1 of 1 positions shown; findings below may reference images not displayed]

FINDINGS: Status post excision of the right breast. The 2 localization wires
are present and intact. The heart shaped tissue marker clip and the
Mohler tissue marker clip and the associated masses and
calcifications are present in the specimen. This was discussed
directly by telephone with the operating room nurse at the time of
interpretation on 11/19/2018 at [DATE] a.m.
IMPRESSION: Specimen radiograph of the RIGHT breast.

## 2020-05-20 ENCOUNTER — Telehealth: Payer: Self-pay

## 2020-05-20 NOTE — Telephone Encounter (Signed)
Left message to schedule her mammogram and bone scan.

## 2020-05-20 NOTE — Telephone Encounter (Signed)
-----   Message from Gae Dry, MD sent at 05/18/2020 10:57 AM EDT ----- Regarding: MMG and DEXA Received notice she has not received MMG nor BONE SCAN yet as ordered at her Annual. Please check and encourage her to do this, and document conversation.

## 2020-06-17 ENCOUNTER — Other Ambulatory Visit: Payer: Medicare Other

## 2020-08-25 ENCOUNTER — Other Ambulatory Visit: Payer: Self-pay

## 2020-08-25 ENCOUNTER — Ambulatory Visit
Admission: RE | Admit: 2020-08-25 | Discharge: 2020-08-25 | Disposition: A | Discharge status: Home / Self Care | Payer: Medicare Other | Source: Ambulatory Visit | Attending: General Surgery | Admitting: General Surgery

## 2020-08-25 DIAGNOSIS — Z1231 Encounter for screening mammogram for malignant neoplasm of breast: Secondary | ICD-10-CM | POA: Diagnosis not present

## 2020-08-25 DIAGNOSIS — N6099 Unspecified benign mammary dysplasia of unspecified breast: Secondary | ICD-10-CM | POA: Diagnosis present

## 2020-12-16 ENCOUNTER — Other Ambulatory Visit: Payer: Self-pay

## 2020-12-16 ENCOUNTER — Ambulatory Visit: Payer: Medicare Other | Attending: Orthopedic Surgery | Admitting: Occupational Therapy

## 2020-12-16 ENCOUNTER — Encounter: Payer: Self-pay | Admitting: Occupational Therapy

## 2020-12-16 DIAGNOSIS — R600 Localized edema: Secondary | ICD-10-CM | POA: Insufficient documentation

## 2020-12-16 DIAGNOSIS — M25641 Stiffness of right hand, not elsewhere classified: Secondary | ICD-10-CM | POA: Insufficient documentation

## 2020-12-16 DIAGNOSIS — L905 Scar conditions and fibrosis of skin: Secondary | ICD-10-CM | POA: Diagnosis present

## 2020-12-16 DIAGNOSIS — M79641 Pain in right hand: Secondary | ICD-10-CM | POA: Diagnosis present

## 2020-12-16 NOTE — Therapy (Signed)
[facility] [address] [place], [address] Phone: [phone]   Fax:  [phone]  Occupational Therapy Evaluation  Patient Details  Name: Terry Hickman MRN: [id number] Date of Birth: 1956-07-06 Referring Provider (OT): Dr Menz/Chris Arvella Nigh   Encounter Date: 12/16/2020   OT End of Session - 12/16/20 1531    Visit Number 1    Number of Visits 8    Date for OT Re-Evaluation 01/27/21    OT Start Time 1400    OT Stop Time 1451    OT Time Calculation (min) 51 min    Activity Tolerance Patient tolerated treatment well    Behavior During Therapy [facility] for tasks assessed/performed           Past Medical History:  Diagnosis Date  . Anemia   . DDD (degenerative disc disease), cervical   . Diabetes mellitus without complication ([place])   . Duodenal ulcer   . Family history of adverse reaction to anesthesia    mother = PONV  . Fibrocystic breast disease   . Gall stone   . GERD (gastroesophageal reflux disease)   . Herpes zoster   . Hypercholesterolemia   . Hypertension   . Hyperthyroidism   . PONV (postoperative nausea and vomiting)   . Shingles   . 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 Sleep apnea   . Vitamin B 12 deficiency   . Vitamin D deficiency     Past Surgical History:  Procedure Laterality Date  . ABDOMINAL HYSTERECTOMY    . BREAST BIOPSY Right 09/13/2018   Papilloma, [place] bx  . BREAST BIOPSY Right 09/13/2018   Papilloma, [place] bx  . BREAST BIOPSY Right 09/27/2018   affirm bx of mass, path pending x marker  . BREAST BIOPSY Right 11/19/2018   Procedure: EXCISION RIGHT BREAST PAPILLOMA WITH NEEDLE LOCALIZATION X2 10:30 START PER MAMMO;  Surgeon: Robert Bellow, MD;  Location: [facility] ORS;  Service: General;  Laterality: Right;  . BREAST EXCISIONAL BIOPSY Right 09/12/1990   negative  . BREAST LUMPECTOMY Right 11/19/2018   papilloma  . COLONOSCOPY WITH PROPOFOL N/A 07/06/2016    Procedure: COLONOSCOPY WITH PROPOFOL;  Surgeon: Manya Silvas, MD;  Location: [facility] ENDOSCOPY;  Service: Endoscopy;  Laterality: N/A;  . ECTOPIC PREGNANCY SURGERY    . HAND SURGERY    . MANDIBLE SURGERY    . WISDOM TOOTH EXTRACTION      There were no vitals filed for this visit.   Subjective Assessment - 12/16/20 1522    Subjective  I had bilateral carpal tunnel release in past and thumb trigger finger nad did not had issues like this one - swelling still and pain with using my finger - tender    Pertinent History Pt had 11/23/20 R 3rd digit trigger finger release- and then check up in 16th - and then stitches were removed and sterristrips applied 12/07/20 - had swelling and pain - had naproxen - but feels like it is not helping - still swolling and pain -and light trigger at times - refer to OT/hand therapy    Patient Stated Goals Want my R hand and middle finger back to normal - swelling and pain better , range of motion and strength better    Currently in Pain? Yes    Pain Score 2    increase to 8/10 with use   Pain Location Hand    Pain Orientation Right  Pain Descriptors / Indicators Aching;Tender;Tightness    Pain Type Surgical pain    Pain Onset 1 to 4 weeks ago    Pain Frequency Constant             [facility] OT Assessment - 12/16/20 0001      Assessment   Medical Diagnosis R 3rd digit trigger finger release    Referring Provider (OT) Dr Menz/Chris Arvella Nigh    Onset Date/Surgical Date 12/16/20    Hand Dominance Right    Next MD Visit Tuesday the 12th April      Home  Environment   Lives With Spouse      Prior Function   Vocation Retired    Corporate investment banker, on phone play games, dog that she walk with leash and throw toys, light house work and cooking      Edema   Edema proximal phalanges increase by 0.9 cm , PIP 0.6 cm      Right Hand AROM   R Index  MCP 0-90 80 Degrees    R Index PIP 0-100 90 Degrees    R Long  MCP 0-90 80 Degrees    R Long PIP 0-100 95 Degrees   -5  - in session 0   R Ring  MCP 0-90 80 Degrees    R Ring PIP 0-100 85 Degrees    R Little  MCP 0-90 80 Degrees    R Little PIP 0-100 95 Degrees                    OT Treatments/Exercises (OP) - 12/16/20 0001      RUE Contrast Bath   Time 8 minutes    Comments prior to soft tissue and ROM review for HEP            2-3 x day contrast to do at home- massage 3rd digit  Gentle AROM for extention of PIP - rolling lightly over roller 15 reps Palm flat on table - check PIP at 0 Tendon glides -AROM - 10 reps - into extention and not force tight fist  Keep pull under 2/10 Scar massage ed on and cica scar  Pad for night time  And silicon compression sleeve for 3rd digit night time and during day some  And MC block splint fabricated for pt to use at night time - per pt request - she sleeps with hand in fist        OT Education - 12/16/20 1531    Education Details findings of eval and HEP    Person(s) Educated Patient    Methods Explanation;Demonstration;Tactile cues;Verbal cues;Handout    Comprehension Verbal cues required;Returned demonstration;Verbalized understanding            OT Short Term Goals - 12/16/20 1537      OT SHORT TERM GOAL #1   Title Pt to be independent in HEP to decrease edema , scar tissue and pain to touch palm and PIP extention WNL    Baseline PIP's 85-95, MC 's 80-90 and PIP extention -5 - edema 0.9 cm at prox phalanges    Time 2    Period Weeks    Status New    Target Date 12/30/20             OT Long Term Goals - 12/16/20 1538      OT LONG TERM GOAL #1   Title Pain in R hand and 3rd digit to decrease to lesst than 2-3/10 at the worse  with use in ADL's    Baseline pain increase from rest 2/10 -to 8/10 with use - edema increase 0.6 to 0.9 cm at prox and PIP compare to L -    Time 4    Period Weeks    Status New    Target Date 01/13/21      OT LONG TERM GOAL #2   Title L hand edema and pain improve for pt to make composite fist  and use hand in more than 75% of ADL's without increase symptoms    Baseline L 3rd digit AROM 80 MC and PIP 95- flexoin- extetion -5 at PIP and pain - tender volar PIP - light trigger at times per pt -and edema increase 0.9 cm prox phalanges - pain increase 8/10 with use    Time 6    Period Weeks    Status New    Target Date 01/27/21                 Plan - 12/16/20 1533    Clinical Impression Statement Pt is 3 1/2 wks s/p R 3rd digit trigger finger release -has history of Diabetes -  pt present at OT eval with R 3rd digit edema increase 0.6 to 0.9 cm compare to L digit, increase pain 2-8/10 with use ,increase stiffness in flexion and end range extention of PIP - Pt is R hand dominant - impairments are limiting pt functional use of R hand in ADL's and IADL's - pt can benefit from OT services    OT Occupational Profile and History Problem Focused Assessment - Including review of records relating to presenting problem    Occupational performance deficits (Please refer to evaluation for details): ADL's;IADL's;Play;Leisure;Social Participation    Body Structure / Function / Physical Skills ADL;Flexibility;Scar mobility;IADL;ROM;Strength;Pain;Edema    Rehab Potential Good    Clinical Decision Making Limited treatment options, no task modification necessary    Comorbidities Affecting Occupational Performance: May have comorbidities impacting occupational performance   Diabetic   Modification or Assistance to Complete Evaluation  No modification of tasks or assist necessary to complete eval    OT Frequency --   2 x wks for 2 wks - then decrease to 1 x wk depending on progress   OT Duration 6 weeks    OT Treatment/Interventions Self-care/ADL training;Ultrasound;Contrast Bath;Fluidtherapy;Therapeutic exercise;Manual Therapy;Splinting;Therapeutic activities;Scar mobilization;Passive range of motion    Consulted and Agree with Plan of Care Patient           Patient will benefit from skilled  therapeutic intervention in order to improve the following deficits and impairments:   Body Structure / Function / Physical Skills: ADL,Flexibility,Scar mobility,IADL,ROM,Strength,Pain,Edema       Visit Diagnosis: Localized edema - Plan: Ot plan of care cert/re-cert  Pain in right hand - Plan: Ot plan of care cert/re-cert  Scar condition and fibrosis of skin - Plan: Ot plan of care cert/re-cert  Stiffness of right hand, not elsewhere classified - Plan: Ot plan of care cert/re-cert    Problem List Patient Active Problem List   Diagnosis Date Noted  . Vitamin D deficiency disease 10/18/2018  . Lymphedema 11/29/2017  . Health care maintenance 11/18/2015  . Essential hypertension 11/17/2014  . Morning headache 02/01/2013  . Severe obesity (BMI 35.0-35.9 with comorbidity) ([place]) 02/01/2013  . Snoring 02/01/2013  . Sleep apnea 12/06/2012  . Back pain 06/28/2012  . Gall stones 04/23/2012  . High risk medication use 12/22/2011  . History of Graves' disease 12/22/2011  . History  of iron deficiency 12/22/2011  . History of non anemic vitamin B12 deficiency 12/22/2011  . Diabetes mellitus type 1, uncontrolled ([place]) 07/13/2011  . GERD (gastroesophageal reflux disease) 07/13/2011  . Hypercholesterolemia 07/13/2011    Rosalyn Gess OTR/L,CLT 12/16/2020, 3:43 PM  [facility] [address] [place], [address] Phone: [phone]   Fax:  [phone]  Name: Terry Hickman MRN: [id number] Date of Birth: 1956/07/18

## 2020-12-16 NOTE — Patient Instructions (Signed)
2-3 x day contrast Gentle AROM for extention of PIP - rolling lightly over roller 15 reps Palm flat on table - check PIP at 0 Tendon glides -AROM - 10 reps - into extention and not force tight fist  Keep pull under 2/10 Scar massage ed on and cica scar  Pad for night time  And silicon compression sleeve for 3rd digit night time and during day some  And MC block splint fabricated for pt to use at night time - per pt request - she sleeps with hand in fist

## 2020-12-22 ENCOUNTER — Other Ambulatory Visit: Payer: Self-pay

## 2020-12-22 ENCOUNTER — Ambulatory Visit: Payer: Medicare Other | Admitting: Occupational Therapy

## 2020-12-22 DIAGNOSIS — R600 Localized edema: Secondary | ICD-10-CM

## 2020-12-22 DIAGNOSIS — M25641 Stiffness of right hand, not elsewhere classified: Secondary | ICD-10-CM

## 2020-12-22 DIAGNOSIS — M79641 Pain in right hand: Secondary | ICD-10-CM

## 2020-12-22 DIAGNOSIS — L905 Scar conditions and fibrosis of skin: Secondary | ICD-10-CM

## 2020-12-22 NOTE — Therapy (Signed)
[facility] [address] [place], [address] Phone: [phone]   Fax:  [phone]  Occupational Therapy Treatment  Patient Details  Name: Terry Hickman MRN: [id number] Date of Birth: [birth year] Referring Provider (OT): Dr Menz/Chris Arvella Nigh   Encounter Date: 12/22/2020   OT End of Session - 12/22/20 1504    Visit Number 2    Number of Visits 8    Date for OT Re-Evaluation 01/27/21    OT Start Time 1430    OT Stop Time 1502    OT Time Calculation (min) 32 min    Activity Tolerance Patient tolerated treatment well    Behavior During Therapy [facility] for tasks assessed/performed           Past Medical History:  Diagnosis Date  . Anemia   . DDD (degenerative disc disease), cervical   . Diabetes mellitus without complication ([place])   . Duodenal ulcer   . Family history of adverse reaction to anesthesia    mother = PONV  . Fibrocystic breast disease   . Gall stone   . GERD (gastroesophageal reflux disease)   . Herpes zoster   . Hypercholesterolemia   . Hypertension   . Hyperthyroidism   . PONV (postoperative nausea and vomiting)   . Shingles   . 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 Sleep apnea   . Vitamin B 12 deficiency   . Vitamin D deficiency     Past Surgical History:  Procedure Laterality Date  . ABDOMINAL HYSTERECTOMY    . BREAST BIOPSY Right 09/13/2018   Papilloma, [place] bx  . BREAST BIOPSY Right 09/13/2018   [place]oma, [place] bx  . BREAST BIOPSY Right 09/27/2018   affirm bx of mass, path pending x marker  . BREAST BIOPSY Right 11/19/2018   Procedure: EXCISION RIGHT BREAST PAPILLOMA WITH NEEDLE LOCALIZATION X2 10:30 START PER MAMMO;  Surgeon: Robert Bellow, MD;  Location: [facility] ORS;  Service: General;  Laterality: Right;  . BREAST EXCISIONAL BIOPSY Right 09/12/1990   negative  . BREAST LUMPECTOMY Right 11/19/2018   papilloma  . COLONOSCOPY WITH PROPOFOL N/A 07/06/2016    Procedure: COLONOSCOPY WITH PROPOFOL;  Surgeon: Manya Silvas, MD;  Location: [facility] ENDOSCOPY;  Service: Endoscopy;  Laterality: N/A;  . ECTOPIC PREGNANCY SURGERY    . HAND SURGERY    . MANDIBLE SURGERY    . WISDOM TOOTH EXTRACTION      There were no vitals filed for this visit.   Subjective Assessment - 12/22/20 1503    Subjective  That middle finger still feels little tender or sore in the joint- but better and not all the time    Pertinent History Pt had 11/23/20 R 3rd digit trigger finger release- and then check up in 16th - and then stitches were removed and sterristrips applied 12/07/20 - had swelling and pain - had naproxen - but feels like it is not helping - still swolling and pain -and light trigger at times - refer to OT/hand therapy    Patient Stated Goals Want my R hand and middle finger back to normal - swelling and pain better , range of motion and strength better    Currently in Pain? Yes    Pain Score 2     Pain Location Finger (Comment which one)    Pain Orientation Right    Pain Descriptors / Indicators Tightness;Tender    Pain Type Surgical pain  Pain Onset 1 to 4 weeks ago    Pain Frequency Intermittent              3rd digit PIP extention WNL - flexion at [facility] 80 and PIP 95  Some tenderness at PIP of 3rd -  Pain less than at last week evaluation           OT Treatments/Exercises (OP) - 12/22/20 0001      RUE Contrast Bath   Time 8 minutes    Comments prior to scar massage and soft tissue             2-3 x day contrast to  Cont at  home- massage 3rd digit  Gentle AROM for extention of PIP - rolling lightly over roller 15 reps Palm flat on table - check PIP at 0 Tendon glides -AROM - 10 reps - into extention and not force tight fist  Keep pull under 2/10 Scar massage - mini massager done and manual by OT- - pt to cont to wear cica scar  Pad for night time  And silicon compression sleeve for 3rd digit night time and during day some  And  MC block splint fabricated  At eval for pt to use at night time - per pt request - she sleeps with hand in fist  Pt appear happier today - with progress than last week at eval         OT Education - 12/22/20 1504    Education Details progress and HEP    Person(s) Educated Patient    Methods Explanation;Demonstration;Tactile cues;Verbal cues;Handout    Comprehension Verbal cues required;Returned demonstration;Verbalized understanding            OT Short Term Goals - 12/16/20 1537      OT SHORT TERM GOAL #1   Title Pt to be independent in HEP to decrease edema , scar tissue and pain to touch palm and PIP extention WNL    Baseline PIP's 85-95, MC 's 80-90 and PIP extention -5 - edema 0.9 cm at prox phalanges    Time 2    Period Weeks    Status New    Target Date 12/30/20             OT Long Term Goals - 12/16/20 1538      OT LONG TERM GOAL #1   Title Pain in R hand and 3rd digit to decrease to lesst than 2-3/10 at the worse with use in ADL's    Baseline pain increase from rest 2/10 -to 8/10 with use - edema increase 0.6 to 0.9 cm at prox and PIP compare to L -    Time 4    Period Weeks    Status New    Target Date 01/13/21      OT LONG TERM GOAL #2   Title L hand edema and pain improve for pt to make composite fist and use hand in more than 75% of ADL's without increase symptoms    Baseline L 3rd digit AROM 80 MC and PIP 95- flexoin- extetion -5 at PIP and pain - tender volar PIP - light trigger at times per pt -and edema increase 0.9 cm prox phalanges - pain increase 8/10 with use    Time 6    Period Weeks    Status New    Target Date 01/27/21                 Plan - 12/22/20 1504  Clinical Impression Statement Pt is 4 1/2 wks s/p R 3rd digit trigger finger release -has history of Diabetes -  pt edema decrease in 3rd digit since last week -and pain decrease not spiking 8/10 at times- still tender at PIP 's some - extention WNL now- flexion at [facility] 80 and PIP  95 - pt to cont with scar mobs, compression on 3rd digit night time and AROM - not resistance or tight grip yet the next week or 2 -follow up this afternoon with surgery    OT Occupational Profile and History Problem Focused Assessment - Including review of records relating to presenting problem    Occupational performance deficits (Please refer to evaluation for details): ADL's;IADL's;Play;Leisure;Social Participation    Body Structure / Function / Physical Skills ADL;Flexibility;Scar mobility;IADL;ROM;Strength;Pain;Edema    Rehab Potential Good    Clinical Decision Making Limited treatment options, no task modification necessary    Comorbidities Affecting Occupational Performance: May have comorbidities impacting occupational performance    Modification or Assistance to Complete Evaluation  No modification of tasks or assist necessary to complete eval    OT Frequency --   2 wks for 2 wks - then 1 x wk   OT Duration --   5 wks   OT Treatment/Interventions Self-care/ADL training;Ultrasound;Contrast Bath;Fluidtherapy;Therapeutic exercise;Manual Therapy;Splinting;Therapeutic activities;Scar mobilization;Passive range of motion    Consulted and Agree with Plan of Care Patient           Patient will benefit from skilled therapeutic intervention in order to improve the following deficits and impairments:   Body Structure / Function / Physical Skills: ADL,Flexibility,Scar mobility,IADL,ROM,Strength,Pain,Edema       Visit Diagnosis: Localized edema  Pain in right hand  Scar condition and fibrosis of skin  Stiffness of right hand, not elsewhere classified    Problem List Patient Active Problem List   Diagnosis Date Noted  . Vitamin D deficiency disease 10/18/2018  . Lymphedema 11/29/2017  . Health care maintenance 11/18/2015  . Essential hypertension 11/17/2014  . Morning headache 02/01/2013  . Severe obesity (BMI 35.0-35.9 with comorbidity) ([place]) 02/01/2013  . Snoring 02/01/2013   . Sleep apnea 12/06/2012  . Back pain 06/28/2012  . Gall stones 04/23/2012  . High risk medication use 12/22/2011  . History of Graves' disease 12/22/2011  . History of iron deficiency 12/22/2011  . History of non anemic vitamin B12 deficiency 12/22/2011  . Diabetes mellitus type 1, uncontrolled ([place]) 07/13/2011  . GERD (gastroesophageal reflux disease) 07/13/2011  . Hypercholesterolemia 07/13/2011    Rosalyn Gess OTR/L,CLT 12/22/2020, 3:08 PM  [facility] [address] [place], [address] Phone: [phone]   Fax:  [phone]  Name: Jewel Mcafee MRN: [id number] Date of Birth: [birth year]

## 2020-12-24 ENCOUNTER — Other Ambulatory Visit: Payer: Self-pay

## 2020-12-24 ENCOUNTER — Ambulatory Visit: Payer: Medicare Other | Admitting: Occupational Therapy

## 2020-12-24 DIAGNOSIS — R600 Localized edema: Secondary | ICD-10-CM

## 2020-12-24 DIAGNOSIS — M25641 Stiffness of right hand, not elsewhere classified: Secondary | ICD-10-CM

## 2020-12-24 DIAGNOSIS — M79641 Pain in right hand: Secondary | ICD-10-CM

## 2020-12-24 DIAGNOSIS — L905 Scar conditions and fibrosis of skin: Secondary | ICD-10-CM

## 2020-12-24 NOTE — Therapy (Signed)
[facility] [address], [place], [address] Phone: [phone]   Fax:  [phone]  Occupational Therapy Treatment  Patient Details  Name: Terry Hickman MRN: [id number] Date of Birth: 08/18/1956 Referring Provider (OT): Dr Menz/Chris Arvella Nigh   Encounter Date: 12/24/2020   OT End of Session - 12/24/20 0826    Visit Number 3    Number of Visits 8    Date for OT Re-Evaluation 01/27/21    OT Start Time 0816    OT Stop Time 0853    OT Time Calculation (min) 37 min    Activity Tolerance Patient tolerated treatment well    Behavior During Therapy [facility] for tasks assessed/performed           Past Medical History:  Diagnosis Date  . Anemia   . DDD (degenerative disc disease), cervical   . Diabetes mellitus without complication ([place])   . Duodenal ulcer   . Family history of adverse reaction to anesthesia    mother = PONV  . Fibrocystic breast disease   . Gall stone   . GERD (gastroesophageal reflux disease)   . Herpes zoster   . Hypercholesterolemia   . Hypertension   . Hyperthyroidism   . PONV (postoperative nausea and vomiting)   . Shingles   . 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 Sleep apnea   . Vitamin B 12 deficiency   . Vitamin D deficiency     Past Surgical History:  Procedure Laterality Date  . ABDOMINAL HYSTERECTOMY    . BREAST BIOPSY Right 09/13/2018   Papilloma, [place] bx  . BREAST BIOPSY Right 09/13/2018   Papilloma, [place] bx  . BREAST BIOPSY Right 09/27/2018   affirm bx of mass, path pending x marker  . BREAST BIOPSY Right 11/19/2018   Procedure: EXCISION RIGHT BREAST PAPILLOMA WITH NEEDLE LOCALIZATION X2 10:30 START PER MAMMO;  Surgeon: Robert Bellow, MD;  Location: [facility] ORS;  Service: General;  Laterality: Right;  . BREAST EXCISIONAL BIOPSY Right 09/12/1990   negative  . BREAST LUMPECTOMY Right 11/19/2018   papilloma  . COLONOSCOPY WITH PROPOFOL N/A 07/06/2016    Procedure: COLONOSCOPY WITH PROPOFOL;  Surgeon: Manya Silvas, MD;  Location: [facility] ENDOSCOPY;  Service: Endoscopy;  Laterality: N/A;  . ECTOPIC PREGNANCY SURGERY    . HAND SURGERY    . MANDIBLE SURGERY    . WISDOM TOOTH EXTRACTION      There were no vitals filed for this visit.   Subjective Assessment - 12/24/20 0826    Subjective  It is early this morning - not done anything - so stiff this am    Pertinent History Pt had 11/23/20 R 3rd digit trigger finger release- and then check up in 16th - and then stitches were removed and sterristrips applied 12/07/20 - had swelling and pain - had naproxen - but feels like it is not helping - still swolling and pain -and light trigger at times - refer to OT/hand therapy    Patient Stated Goals Want my R hand and middle finger back to normal - swelling and pain better , range of motion and strength better    Currently in Pain? Yes    Pain Score 3     Pain Location Finger (Comment which one)    Pain Orientation Right    Pain Descriptors / Indicators Tightness;Aching;Tender    Pain Onset 1 to 4 weeks ago    Pain Frequency  Intermittent              [facility] OT Assessment - 12/24/20 0001      Right Hand AROM   R Long  MCP 0-90 80 Degrees    R Long PIP 0-100 95 Degrees   0           coming in with very little edema in 3rd digit- more uncomfortable feeling with composite fist- 3/10         OT Treatments/Exercises (OP) - 12/24/20 0001      RUE Fluidotherapy   Number Minutes Fluidotherapy 8 Minutes    RUE Fluidotherapy Location Hand    Comments prior to scar massage and ROM - decrease stiffness         Done ice rotation 2 x during fluido    2-3 x day contrastto  Cont at  home- massage 3rd digit  Gentle AROM for extention of PIP - rolling lightly over roller 15 reps Palm flat on table - check PIP at 0 Tendon glides -AROM - 10 reps - into extention and not force tight fist  Keep pull under 2/10 Scar massage - mini massager  done and manual by OT- - pt to cont to wear cica scar Pad for night time  And silicon compression sleeve for 3rd digit night time and during day some  And MC block splint fabricated  At eval for pt to use at night time - per pt request - she sleeps with hand in fist Pt appear happier today - with progress than last week at eval          OT Education - 12/24/20 0826    Education Details progress and HEP    Person(s) Educated Patient    Methods Explanation;Demonstration;Tactile cues;Verbal cues;Handout    Comprehension Verbal cues required;Returned demonstration;Verbalized understanding            OT Short Term Goals - 12/16/20 1537      OT SHORT TERM GOAL #1   Title Pt to be independent in HEP to decrease edema , scar tissue and pain to touch palm and PIP extention WNL    Baseline PIP's 85-95, MC 's 80-90 and PIP extention -5 - edema 0.9 cm at prox phalanges    Time 2    Period Weeks    Status New    Target Date 12/30/20             OT Long Term Goals - 12/16/20 1538      OT LONG TERM GOAL #1   Title Pain in R hand and 3rd digit to decrease to lesst than 2-3/10 at the worse with use in ADL's    Baseline pain increase from rest 2/10 -to 8/10 with use - edema increase 0.6 to 0.9 cm at prox and PIP compare to L -    Time 4    Period Weeks    Status New    Target Date 01/13/21      OT LONG TERM GOAL #2   Title L hand edema and pain improve for pt to make composite fist and use hand in more than 75% of ADL's without increase symptoms    Baseline L 3rd digit AROM 80 MC and PIP 95- flexoin- extetion -5 at PIP and pain - tender volar PIP - light trigger at times per pt -and edema increase 0.9 cm prox phalanges - pain increase 8/10 with use    Time 6    Period Weeks  Status New    Target Date 01/27/21                 Plan - 12/24/20 0827    Clinical Impression Statement Pt is 5 wks s/p R 3rd digit trigger finger release -has history of Diabetes -  pt edema  decrease in 3rd digit since last week -and pain decrease not spiking 8/10 at times- still tender at PIP 's some - extention WNL now- flexion at [facility] 80 and PIP 95 - pt to cont with scar mobs, compression on 3rd digit night time and AROM - not resistance or tight grip yet the next week or 2 - scar tissue improving    OT Occupational Profile and History Problem Focused Assessment - Including review of records relating to presenting problem    Occupational performance deficits (Please refer to evaluation for details): ADL's;IADL's;Play;Leisure;Social Participation    Body Structure / Function / Physical Skills ADL;Flexibility;Scar mobility;IADL;ROM;Strength;Pain;Edema    Rehab Potential Good    Clinical Decision Making Limited treatment options, no task modification necessary    Comorbidities Affecting Occupational Performance: May have comorbidities impacting occupational performance    Modification or Assistance to Complete Evaluation  No modification of tasks or assist necessary to complete eval    OT Frequency 2x / week    OT Duration 4 weeks    OT Treatment/Interventions Self-care/ADL training;Ultrasound;Contrast Bath;Fluidtherapy;Therapeutic exercise;Manual Therapy;Splinting;Therapeutic activities;Scar mobilization;Passive range of motion    Consulted and Agree with Plan of Care Patient           Patient will benefit from skilled therapeutic intervention in order to improve the following deficits and impairments:   Body Structure / Function / Physical Skills: ADL,Flexibility,Scar mobility,IADL,ROM,Strength,Pain,Edema       Visit Diagnosis: Localized edema  Pain in right hand  Scar condition and fibrosis of skin  Stiffness of right hand, not elsewhere classified    Problem List Patient Active Problem List   Diagnosis Date Noted  . Vitamin D deficiency disease 10/18/2018  . Lymphedema 11/29/2017  . Health care maintenance 11/18/2015  . Essential hypertension 11/17/2014  .  Morning headache 02/01/2013  . Severe obesity (BMI 35.0-35.9 with comorbidity) ([place]) 02/01/2013  . Snoring 02/01/2013  . Sleep apnea 12/06/2012  . Back pain 06/28/2012  . Gall stones 04/23/2012  . High risk medication use 12/22/2011  . History of Graves' disease 12/22/2011  . History of iron deficiency 12/22/2011  . History of non anemic vitamin B12 deficiency 12/22/2011  . Diabetes mellitus type 1, uncontrolled ([place]) 07/13/2011  . GERD (gastroesophageal reflux disease) 07/13/2011  . Hypercholesterolemia 07/13/2011    Rosalyn Gess OTR/L,CLT 12/24/2020, 8:55 AM  [facility] [address] [place], [address] Phone: [phone]   Fax:  [phone]  Name: Terry Hickman MRN: [id number] Date of Birth: 25-May-1956

## 2020-12-31 ENCOUNTER — Ambulatory Visit: Payer: Medicare Other | Admitting: Occupational Therapy

## 2021-01-04 ENCOUNTER — Ambulatory Visit: Payer: Medicare Other | Admitting: Occupational Therapy

## 2021-01-06 ENCOUNTER — Ambulatory Visit: Payer: Medicare Other | Admitting: Occupational Therapy

## 2021-01-12 ENCOUNTER — Other Ambulatory Visit: Payer: Self-pay

## 2021-01-12 ENCOUNTER — Ambulatory Visit: Payer: Medicare Other | Attending: Orthopedic Surgery | Admitting: Occupational Therapy

## 2021-01-12 DIAGNOSIS — L905 Scar conditions and fibrosis of skin: Secondary | ICD-10-CM

## 2021-01-12 DIAGNOSIS — M25641 Stiffness of right hand, not elsewhere classified: Secondary | ICD-10-CM | POA: Diagnosis present

## 2021-01-12 DIAGNOSIS — R600 Localized edema: Secondary | ICD-10-CM | POA: Insufficient documentation

## 2021-01-12 DIAGNOSIS — M79641 Pain in right hand: Secondary | ICD-10-CM | POA: Diagnosis present

## 2021-01-12 NOTE — Therapy (Signed)
[facility] [address] [place], [address] Phone: [phone]   Fax:  [phone]  Occupational Therapy Treatment  Patient Details  Name: Terry Hickman MRN: [id number] Date of Birth: Feb 04, 1956 Referring Provider (OT): Dr Menz/Chris Arvella Nigh   Encounter Date: 01/12/2021   OT End of Session - 01/12/21 1208    Visit Number 4    Number of Visits 8    Date for OT Re-Evaluation 01/27/21    OT Start Time 1121    OT Stop Time 1157    OT Time Calculation (min) 36 min    Activity Tolerance Patient tolerated treatment well    Behavior During Therapy [facility] for tasks assessed/performed           Past Medical History:  Diagnosis Date  . Anemia   . DDD (degenerative disc disease), cervical   . Diabetes mellitus without complication ([place])   . Duodenal ulcer   . Family history of adverse reaction to anesthesia    mother = PONV  . Fibrocystic breast disease   . Gall stone   . GERD (gastroesophageal reflux disease)   . Herpes zoster   . Hypercholesterolemia   . Hypertension   . Hyperthyroidism   . PONV (postoperative nausea and vomiting)   . Shingles   . 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 Sleep apnea   . Vitamin B 12 deficiency   . Vitamin D deficiency     Past Surgical History:  Procedure Laterality Date  . ABDOMINAL HYSTERECTOMY    . BREAST BIOPSY Right 09/13/2018   Papilloma, [place] bx  . BREAST BIOPSY Right 09/13/2018   Papilloma, [place] bx  . BREAST BIOPSY Right 09/27/2018   affirm bx of mass, path pending x marker  . BREAST BIOPSY Right 11/19/2018   Procedure: EXCISION RIGHT BREAST PAPILLOMA WITH NEEDLE LOCALIZATION X2 10:30 START PER MAMMO;  Surgeon: Robert Bellow, MD;  Location: [facility] ORS;  Service: General;  Laterality: Right;  . BREAST EXCISIONAL BIOPSY Right 09/12/1990   negative  . BREAST LUMPECTOMY Right 11/19/2018   papilloma  . COLONOSCOPY WITH PROPOFOL N/A 07/06/2016    Procedure: COLONOSCOPY WITH PROPOFOL;  Surgeon: Manya Silvas, MD;  Location: [facility] ENDOSCOPY;  Service: Endoscopy;  Laterality: N/A;  . ECTOPIC PREGNANCY SURGERY    . HAND SURGERY    . MANDIBLE SURGERY    . WISDOM TOOTH EXTRACTION      There were no vitals filed for this visit.   Subjective Assessment - 01/12/21 1159    Subjective  Doing better than when I seen you last - stiff in the morning and some soreness in middle joint - but not pain and using it -still favor little    Pertinent History Pt had 11/23/20 R 3rd digit trigger finger release- and then check up in 16th - and then stitches were removed and sterristrips applied 12/07/20 - had swelling and pain - had naproxen - but feels like it is not helping - still swolling and pain -and light trigger at times - refer to OT/hand therapy    Patient Stated Goals Want my R hand and middle finger back to normal - swelling and pain better , range of motion and strength better    Currently in Pain? Yes    Pain Score 2     Pain Location Finger (Comment which one)    Pain Orientation Right    Pain Descriptors / Indicators Sore  Pain Type Surgical pain    Pain Onset More than a month ago    Pain Frequency Intermittent             Great progress - extention WNL and edema improve greatly - only some soreness on volar PIP MC flexion 85 and PIP 95            OT Treatments/Exercises (OP) - 01/12/21 0001      RUE Fluidotherapy   Number Minutes Fluidotherapy 8 Minutes    RUE Fluidotherapy Location Hand    Comments prior to scar massage and ROM           Done ice rotation 2 x during fluido    Cont with heat or contrast , scar massage and ROM - increase functional use  Gentle AROM for extention of PIP - rolling lightly over roller 15 reps Palm flat on table - check PIP at 0 Tendon glides -AROM - 10 reps - into extention and not force tight fist   Scar massage- mini massager done and manual by OT- - pt to cont to wear  cica scar Pad for night time          OT Education - 01/12/21 1208    Education Details progress and HEP    Person(s) Educated Patient    Methods Explanation;Demonstration;Tactile cues;Verbal cues;Handout    Comprehension Verbal cues required;Returned demonstration;Verbalized understanding            OT Short Term Goals - 12/16/20 1537      OT SHORT TERM GOAL #1   Title Pt to be independent in HEP to decrease edema , scar tissue and pain to touch palm and PIP extention WNL    Baseline PIP's 85-95, MC 's 80-90 and PIP extention -5 - edema 0.9 cm at prox phalanges    Time 2    Period Weeks    Status New    Target Date 12/30/20             OT Long Term Goals - 12/16/20 1538      OT LONG TERM GOAL #1   Title Pain in R hand and 3rd digit to decrease to lesst than 2-3/10 at the worse with use in ADL's    Baseline pain increase from rest 2/10 -to 8/10 with use - edema increase 0.6 to 0.9 cm at prox and PIP compare to L -    Time 4    Period Weeks    Status New    Target Date 01/13/21      OT LONG TERM GOAL #2   Title L hand edema and pain improve for pt to make composite fist and use hand in more than 75% of ADL's without increase symptoms    Baseline L 3rd digit AROM 80 MC and PIP 95- flexoin- extetion -5 at PIP and pain - tender volar PIP - light trigger at times per pt -and edema increase 0.9 cm prox phalanges - pain increase 8/10 with use    Time 6    Period Weeks    Status New    Target Date 01/27/21                 Plan - 01/12/21 1209    Clinical Impression Statement Pt is 7 1/2 wks s/p R 3rd digit trigger finger release -has history of Diabetes -  pt edema decrease in 3rd digit  and  pain - only some soreness on volar PIP - extention  WNL and Flexion MC 85 and PIP 95-  pt to cont with scar mobs and functional use increase    OT Occupational Profile and History Problem Focused Assessment - Including review of records relating to presenting problem     Occupational performance deficits (Please refer to evaluation for details): ADL's;IADL's;Play;Leisure;Social Participation    Body Structure / Function / Physical Skills ADL;Flexibility;Scar mobility;IADL;ROM;Strength;Pain;Edema    Rehab Potential Good    Clinical Decision Making Limited treatment options, no task modification necessary    Comorbidities Affecting Occupational Performance: May have comorbidities impacting occupational performance    Modification or Assistance to Complete Evaluation  No modification of tasks or assist necessary to complete eval    OT Frequency 2x / week    OT Duration 4 weeks    OT Treatment/Interventions Self-care/ADL training;Ultrasound;Contrast Bath;Fluidtherapy;Therapeutic exercise;Manual Therapy;Splinting;Therapeutic activities;Scar mobilization;Passive range of motion           Patient will benefit from skilled therapeutic intervention in order to improve the following deficits and impairments:   Body Structure / Function / Physical Skills: ADL,Flexibility,Scar mobility,IADL,ROM,Strength,Pain,Edema       Visit Diagnosis: Pain in right hand  Localized edema  Scar condition and fibrosis of skin  Stiffness of right hand, not elsewhere classified    Problem List Patient Active Problem List   Diagnosis Date Noted  . Vitamin D deficiency disease 10/18/2018  . Lymphedema 11/29/2017  . Health care maintenance 11/18/2015  . Essential hypertension 11/17/2014  . Morning headache 02/01/2013  . Severe obesity (BMI 35.0-35.9 with comorbidity) ([place]) 02/01/2013  . Snoring 02/01/2013  . Sleep apnea 12/06/2012  . Back pain 06/28/2012  . Gall stones 04/23/2012  . High risk medication use 12/22/2011  . History of Graves' disease 12/22/2011  . History of iron deficiency 12/22/2011  . History of non anemic vitamin B12 deficiency 12/22/2011  . Diabetes mellitus type 1, uncontrolled ([place]) 07/13/2011  . GERD (gastroesophageal reflux disease) 07/13/2011   . Hypercholesterolemia 07/13/2011    Rosalyn Gess  OTR/L,CLT 01/12/2021, 12:11 PM  [facility] [address], [place], [address] Phone: [phone]   Fax:  [phone]  Name: Terry Hickman MRN: [id number] Date of Birth: [birth year]

## 2021-01-15 ENCOUNTER — Ambulatory Visit: Payer: Medicare Other | Admitting: Occupational Therapy

## 2021-01-15 ENCOUNTER — Other Ambulatory Visit: Payer: Self-pay

## 2021-01-15 DIAGNOSIS — L905 Scar conditions and fibrosis of skin: Secondary | ICD-10-CM

## 2021-01-15 DIAGNOSIS — M79641 Pain in right hand: Secondary | ICD-10-CM | POA: Diagnosis not present

## 2021-01-15 DIAGNOSIS — R600 Localized edema: Secondary | ICD-10-CM

## 2021-01-15 DIAGNOSIS — M25641 Stiffness of right hand, not elsewhere classified: Secondary | ICD-10-CM

## 2021-01-15 NOTE — Therapy (Signed)
[facility] [address] [place], [address] Phone: [phone]   Fax:  [phone]  Occupational Therapy Treatment  Patient Details  Name: Terry Hickman MRN: [id number] Date of Birth: March 15, 1956 Referring Provider (OT): Dr Menz/Chris Arvella Nigh   Encounter Date: 01/15/2021   OT End of Session - 01/15/21 1350    Visit Number 5    Number of Visits 8    Date for OT Re-Evaluation 01/27/21    OT Start Time 1053    OT Stop Time 1129    OT Time Calculation (min) 36 min    Activity Tolerance Patient tolerated treatment well    Behavior During Therapy [facility] for tasks assessed/performed           Past Medical History:  Diagnosis Date  . Anemia   . DDD (degenerative disc disease), cervical   . Diabetes mellitus without complication ([place])   . Duodenal ulcer   . Family history of adverse reaction to anesthesia    mother = PONV  . Fibrocystic breast disease   . Gall stone   . GERD (gastroesophageal reflux disease)   . Herpes zoster   . Hypercholesterolemia   . Hypertension   . Hyperthyroidism   . PONV (postoperative nausea and vomiting)   . Shingles   . 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 Sleep apnea   . Vitamin B 12 deficiency   . Vitamin D deficiency     Past Surgical History:  Procedure Laterality Date  . ABDOMINAL HYSTERECTOMY    . BREAST BIOPSY Right 09/13/2018   Papilloma, [place] bx  . BREAST BIOPSY Right 09/13/2018   Papilloma, [place] bx  . BREAST BIOPSY Right 09/27/2018   affirm bx of mass, path pending x marker  . BREAST BIOPSY Right 11/19/2018   Procedure: EXCISION RIGHT BREAST PAPILLOMA WITH NEEDLE LOCALIZATION X2 10:30 START PER MAMMO;  Surgeon: Robert Bellow, MD;  Location: [facility] ORS;  Service: General;  Laterality: Right;  . BREAST EXCISIONAL BIOPSY Right 09/12/1990   negative  . BREAST LUMPECTOMY Right 11/19/2018   papilloma  . COLONOSCOPY WITH PROPOFOL N/A 07/06/2016    Procedure: COLONOSCOPY WITH PROPOFOL;  Surgeon: Manya Silvas, MD;  Location: [facility] ENDOSCOPY;  Service: Endoscopy;  Laterality: N/A;  . ECTOPIC PREGNANCY SURGERY    . HAND SURGERY    . MANDIBLE SURGERY    . WISDOM TOOTH EXTRACTION      There were no vitals filed for this visit.   Subjective Assessment - 01/15/21 1349    Subjective  I seen you last time in the afternoon -  stiff in the morning and some soreness in middle joint - but not pain and using it more -still favor little    Pertinent History Pt had 11/23/20 R 3rd digit trigger finger release- and then check up in 16th - and then stitches were removed and sterristrips applied 12/07/20 - had swelling and pain - had naproxen - but feels like it is not helping - still swolling and pain -and light trigger at times - refer to OT/hand therapy    Patient Stated Goals Want my R hand and middle finger back to normal - swelling and pain better , range of motion and strength better    Currently in Pain? Yes    Pain Score 1     Pain Location Finger (Comment which one)    Pain Orientation Right    Pain Descriptors /  Indicators Tightness;Sore    Pain Type Surgical pain    Pain Onset More than a month ago    Pain Frequency Occasional             AROM in 3rd digit MC 85 and PIP 90 - was am appt - Pt was last time seen in afternoon- and 95 was PIP flexion  extention WNL  No clicking            OT Treatments/Exercises (OP) - 01/15/21 0001      RUE Fluidotherapy   Number Minutes Fluidotherapy 8 Minutes    RUE Fluidotherapy Location Hand    Comments prior to scar tissue and ROM               Cont with heat or contrast , scar massage and ROM - increase functional use  Gentle AROM for extention of PIP - rolling lightly over roller 15 reps Palm flat on table - check PIP at 0 Tendon glides -AROM - 10 reps - into extention and not force tight fist   Scar massage- mini massager done and manual by OT- - pt to cont to  wear cica scar Pad for night time  isotoner glove to use for 2-3 wks at night time        OT Education - 01/15/21 1350    Education Details progress and HEP    Person(s) Educated Patient    Methods Explanation;Demonstration;Tactile cues;Verbal cues;Handout    Comprehension Verbal cues required;Returned demonstration;Verbalized understanding            OT Short Term Goals - 12/16/20 1537      OT SHORT TERM GOAL #1   Title Pt to be independent in HEP to decrease edema , scar tissue and pain to touch palm and PIP extention WNL    Baseline PIP's 85-95, MC 's 80-90 and PIP extention -5 - edema 0.9 cm at prox phalanges    Time 2    Period Weeks    Status New    Target Date 12/30/20             OT Long Term Goals - 12/16/20 1538      OT LONG TERM GOAL #1   Title Pain in R hand and 3rd digit to decrease to lesst than 2-3/10 at the worse with use in ADL's    Baseline pain increase from rest 2/10 -to 8/10 with use - edema increase 0.6 to 0.9 cm at prox and PIP compare to L -    Time 4    Period Weeks    Status New    Target Date 01/13/21      OT LONG TERM GOAL #2   Title L hand edema and pain improve for pt to make composite fist and use hand in more than 75% of ADL's without increase symptoms    Baseline L 3rd digit AROM 80 MC and PIP 95- flexoin- extetion -5 at PIP and pain - tender volar PIP - light trigger at times per pt -and edema increase 0.9 cm prox phalanges - pain increase 8/10 with use    Time 6    Period Weeks    Status New    Target Date 01/27/21                 Plan - 01/15/21 1352    Clinical Impression Statement Pt is 8 wks s/p R 3rd digit trigger finger release -has history of Diabetes -  pt edema  decrease in 3rd digit  and  pain - only some soreness on volar PIP - extention WNL and Flexion MC 85 and PIP 90-  scar tissue improving greatly - some stiffness in the am and edema but decrease as the day goes- pt to cont with HEP for 3 wks and follow up  with me    OT Occupational Profile and History Problem Focused Assessment - Including review of records relating to presenting problem    Occupational performance deficits (Please refer to evaluation for details): ADL's;IADL's;Play;Leisure;Social Participation    Body Structure / Function / Physical Skills ADL;Flexibility;Scar mobility;IADL;ROM;Strength;Pain;Edema    Rehab Potential Good    Clinical Decision Making Limited treatment options, no task modification necessary    Comorbidities Affecting Occupational Performance: May have comorbidities impacting occupational performance    Modification or Assistance to Complete Evaluation  No modification of tasks or assist necessary to complete eval    OT Frequency 2x / week    OT Duration 4 weeks    OT Treatment/Interventions Self-care/ADL training;Ultrasound;Contrast Bath;Fluidtherapy;Therapeutic exercise;Manual Therapy;Splinting;Therapeutic activities;Scar mobilization;Passive range of motion    Consulted and Agree with Plan of Care Patient           Patient will benefit from skilled therapeutic intervention in order to improve the following deficits and impairments:   Body Structure / Function / Physical Skills: ADL,Flexibility,Scar mobility,IADL,ROM,Strength,Pain,Edema       Visit Diagnosis: Pain in right hand  Localized edema  Scar condition and fibrosis of skin  Stiffness of right hand, not elsewhere classified    Problem List Patient Active Problem List   Diagnosis Date Noted  . Vitamin D deficiency disease 10/18/2018  . Lymphedema 11/29/2017  . Health care maintenance 11/18/2015  . Essential hypertension 11/17/2014  . Morning headache 02/01/2013  . Severe obesity (BMI 35.0-35.9 with comorbidity) ([place]) 02/01/2013  . Snoring 02/01/2013  . Sleep apnea 12/06/2012  . Back pain 06/28/2012  . Gall stones 04/23/2012  . High risk medication use 12/22/2011  . History of Graves' disease 12/22/2011  . History of iron  deficiency 12/22/2011  . History of non anemic vitamin B12 deficiency 12/22/2011  . Diabetes mellitus type 1, uncontrolled ([place]) 07/13/2011  . GERD (gastroesophageal reflux disease) 07/13/2011  . Hypercholesterolemia 07/13/2011    Rosalyn Gess OTR/L,CLT 01/15/2021, 1:55 PM  [place] [facility] [address] [place], [address] Phone: [phone]   Fax:  [phone]  Name: Collene Massimino MRN: [id number] Date of Birth: 1956/07/06

## 2021-02-09 ENCOUNTER — Ambulatory Visit: Payer: Medicare Other | Admitting: Occupational Therapy

## 2021-02-15 ENCOUNTER — Other Ambulatory Visit: Payer: Self-pay

## 2021-02-15 ENCOUNTER — Ambulatory Visit: Payer: Medicare Other | Attending: Orthopedic Surgery | Admitting: Occupational Therapy

## 2021-02-15 DIAGNOSIS — L905 Scar conditions and fibrosis of skin: Secondary | ICD-10-CM | POA: Insufficient documentation

## 2021-02-15 DIAGNOSIS — M25641 Stiffness of right hand, not elsewhere classified: Secondary | ICD-10-CM | POA: Insufficient documentation

## 2021-02-15 DIAGNOSIS — M79641 Pain in right hand: Secondary | ICD-10-CM | POA: Diagnosis present

## 2021-02-15 DIAGNOSIS — R600 Localized edema: Secondary | ICD-10-CM | POA: Diagnosis present

## 2021-02-15 NOTE — Therapy (Signed)
[facility] [address] [place], [address] Phone: [phone]   Fax:  [phone]  Occupational Therapy Treatment  Patient Details  Name: Terry Hickman MRN: [id number] Date of Birth: 1955-10-22 Referring Provider (OT): Dr Menz/Chris Arvella Nigh   Encounter Date: 02/15/2021   OT End of Session - 02/15/21 1416    Visit Number 6    Number of Visits 7    Date for OT Re-Evaluation 03/15/21    OT Start Time 1351    OT Stop Time 1410    OT Time Calculation (min) 19 min    Activity Tolerance Patient tolerated treatment well    Behavior During Therapy [facility] for tasks assessed/performed           Past Medical History:  Diagnosis Date  . Anemia   . DDD (degenerative disc disease), cervical   . Diabetes mellitus without complication ([place])   . Duodenal ulcer   . Family history of adverse reaction to anesthesia    mother = PONV  . Fibrocystic breast disease   . Gall stone   . GERD (gastroesophageal reflux disease)   . Herpes zoster   . Hypercholesterolemia   . Hypertension   . Hyperthyroidism   . PONV (postoperative nausea and vomiting)   . Shingles   . 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 Sleep apnea   . Vitamin B 12 deficiency   . Vitamin D deficiency     Past Surgical History:  Procedure Laterality Date  . ABDOMINAL HYSTERECTOMY    . BREAST BIOPSY Right 09/13/2018   Papilloma, [place] bx  . BREAST BIOPSY Right 09/13/2018   Papilloma, [place] bx  . BREAST BIOPSY Right 09/27/2018   affirm bx of mass, path pending x marker  . BREAST BIOPSY Right 11/19/2018   Procedure: EXCISION RIGHT BREAST PAPILLOMA WITH NEEDLE LOCALIZATION X2 10:30 START PER MAMMO;  Surgeon: Robert Bellow, MD;  Location: [facility] ORS;  Service: General;  Laterality: Right;  . BREAST EXCISIONAL BIOPSY Right 09/12/1990   negative  . BREAST LUMPECTOMY Right 11/19/2018   papilloma  . COLONOSCOPY WITH PROPOFOL N/A 07/06/2016    Procedure: COLONOSCOPY WITH PROPOFOL;  Surgeon: Manya Silvas, MD;  Location: [facility] ENDOSCOPY;  Service: Endoscopy;  Laterality: N/A;  . ECTOPIC PREGNANCY SURGERY    . HAND SURGERY    . MANDIBLE SURGERY    . WISDOM TOOTH EXTRACTION      There were no vitals filed for this visit.   Subjective Assessment - 02/15/21 1413    Subjective  It still feels stiff in the am and sometimes during day - making tight fist - I did use the zero turn lawnmower the other day and after using it hour - my middle finger was tight- they ended up giving me a shot - still feels little knot    Pertinent History Pt had 11/23/20 R 3rd digit trigger finger release- and then check up in 16th - and then stitches were removed and sterristrips applied 12/07/20 - had swelling and pain - had naproxen - but feels like it is not helping - still swolling and pain -and light trigger at times - refer to OT/hand therapy    Patient Stated Goals Want my R hand and middle finger back to normal - swelling and pain better , range of motion and strength better    Currently in Pain? --   Stiff and swollen feeling in 3rd  [facility] OT Assessment - 02/15/21 0001      Strength   Right Hand Grip (lbs) 49    Right Hand Lateral Pinch 16 lbs    Right Hand 3 Point Pinch 14 lbs    Left Hand Grip (lbs) 52    Left Hand Lateral Pinch 14 lbs    Left Hand 3 Point Pinch 13 lbs      Right Hand AROM   R Index  MCP 0-90 80 Degrees    R Index PIP 0-100 90 Degrees    R Long  MCP 0-90 80 Degrees   L 80   R Long PIP 0-100 90 Degrees   Full ext, L hand 100             WNL for extention of 3rd and edema increase 0.5 cm at proximal phalanges of 3rd-  MC flexion same as L - PIP decrease 5 degrees- but normal for some edema - with use of lawnmower and in the am  Pt is diabetic No clicking     Cont  Contrast in the am or if used it a lot  Gentle AROM for extention of PIP - rolling lightly over roller 15 reps Tendon glides -AROM -  10 reps - into extention and not force tight fist   For month -  Grip and prehension WNL - no pain except if press on scar -but no pain with motion - mostly tightness or stiffness- but edema- pt now 3 months s/p                 OT Education - 02/15/21 1416    Education Details progress and HEP    Person(s) Educated Patient    Methods Explanation;Demonstration;Tactile cues;Verbal cues;Handout    Comprehension Verbal cues required;Returned demonstration;Verbalized understanding            OT Short Term Goals - 02/15/21 1422      OT SHORT TERM GOAL #1   Title Pt to be independent in HEP to decrease edema , scar tissue and pain to touch palm and PIP extention WNL             OT Long Term Goals - 02/15/21 1422      OT LONG TERM GOAL #1   Title Pain in R hand and 3rd digit to decrease to lesst than 2-3/10 at the worse with use in ADL's      OT LONG TERM GOAL #2   Title L hand edema and pain improve for pt to make composite fist and use hand in more than 75% of ADL's without increase symptoms    Baseline L 3rd digit AROM 80 MC and PIP 95- flexoin- extetion -5 at PIP and pain - tender volar PIP - light trigger at times per pt -and edema increase 0.9 cm prox phalanges - pain increase 8/10 with use   NOW - doing great - still 0.5 cm edema in PIP and flexion 90 , MC same as other - grip /prehension WNL    Time 4    Period Weeks    Target Date 03/15/21                 Plan - 02/15/21 1418    Clinical Impression Statement Pt is 12 wks s/p R 3rd digit trigger finger release -has history of Diabetes -  pt edema decrease in 3rd digit  and pain but cont to have some stiffness and 0.5 cm edema in proximal phalanges-  reminded pt she will still have some edema and stiffness if done static grip or mowing lawn - AROM decrease at PIP with intrinsic fist and composite- but grip and prehension strength WNL - pt to cont with some contrast in am , extention massage and tendon glides  for another month -and call if still having some issues    OT Occupational Profile and History Problem Focused Assessment - Including review of records relating to presenting problem    Occupational performance deficits (Please refer to evaluation for details): ADL's;IADL's;Play;Leisure;Social Participation    Body Structure / Function / Physical Skills ADL;Flexibility;Scar mobility;IADL;ROM;Strength;Pain;Edema    Rehab Potential Good    Clinical Decision Making Limited treatment options, no task modification necessary    Comorbidities Affecting Occupational Performance: May have comorbidities impacting occupational performance    Modification or Assistance to Complete Evaluation  No modification of tasks or assist necessary to complete eval    OT Frequency Monthly    OT Duration 4 weeks    OT Treatment/Interventions Self-care/ADL training;Ultrasound;Contrast Bath;Fluidtherapy;Therapeutic exercise;Manual Therapy;Splinting;Therapeutic activities;Scar mobilization;Passive range of motion    Consulted and Agree with Plan of Care Patient           Patient will benefit from skilled therapeutic intervention in order to improve the following deficits and impairments:   Body Structure / Function / Physical Skills: ADL,Flexibility,Scar mobility,IADL,ROM,Strength,Pain,Edema       Visit Diagnosis: Pain in right hand - Plan: Ot plan of care cert/re-cert  Localized edema - Plan: Ot plan of care cert/re-cert  Scar condition and fibrosis of skin - Plan: Ot plan of care cert/re-cert  Stiffness of right hand, not elsewhere classified - Plan: Ot plan of care cert/re-cert    Problem List Patient Active Problem List   Diagnosis Date Noted  . Vitamin D deficiency disease 10/18/2018  . Lymphedema 11/29/2017  . Health care maintenance 11/18/2015  . Essential hypertension 11/17/2014  . Morning headache 02/01/2013  . Severe obesity (BMI 35.0-35.9 with comorbidity) ([place]) 02/01/2013  . Snoring  02/01/2013  . Sleep apnea 12/06/2012  . Back pain 06/28/2012  . Gall stones 04/23/2012  . High risk medication use 12/22/2011  . History of Graves' disease 12/22/2011  . History of iron deficiency 12/22/2011  . History of non anemic vitamin B12 deficiency 12/22/2011  . Diabetes mellitus type 1, uncontrolled ([place]) 07/13/2011  . GERD (gastroesophageal reflux disease) 07/13/2011  . Hypercholesterolemia 07/13/2011    Rosalyn Gess OTR/L,CLT 02/15/2021, 2:26 PM  [facility] [address], [place], [address] Phone: [phone]   Fax:  [phone]  Name: Terry Hickman MRN: [id number] Date of Birth: May 24, 1956

## 2021-04-06 ENCOUNTER — Other Ambulatory Visit (HOSPITAL_COMMUNITY)
Admission: RE | Admit: 2021-04-06 | Discharge: 2021-04-06 | Disposition: A | Discharge status: Home / Self Care | Payer: Medicare Other | Source: Ambulatory Visit | Attending: Obstetrics & Gynecology | Admitting: Obstetrics & Gynecology

## 2021-04-06 ENCOUNTER — Encounter: Payer: Self-pay | Admitting: Obstetrics & Gynecology

## 2021-04-06 ENCOUNTER — Other Ambulatory Visit: Payer: Self-pay

## 2021-04-06 ENCOUNTER — Ambulatory Visit (INDEPENDENT_AMBULATORY_CARE_PROVIDER_SITE_OTHER): Payer: Medicare Other | Admitting: Obstetrics & Gynecology

## 2021-04-06 VITALS — BP 130/80 | Ht 68.0 in | Wt 234.0 lb

## 2021-04-06 DIAGNOSIS — Z1272 Encounter for screening for malignant neoplasm of vagina: Secondary | ICD-10-CM

## 2021-04-06 DIAGNOSIS — R300 Dysuria: Secondary | ICD-10-CM

## 2021-04-06 DIAGNOSIS — N3941 Urge incontinence: Secondary | ICD-10-CM | POA: Diagnosis not present

## 2021-04-06 DIAGNOSIS — N3946 Mixed incontinence: Secondary | ICD-10-CM | POA: Insufficient documentation

## 2021-04-06 DIAGNOSIS — N3001 Acute cystitis with hematuria: Secondary | ICD-10-CM

## 2021-04-06 LAB — POCT URINALYSIS DIPSTICK
Bilirubin, UA: NEGATIVE
Blood, UA: POSITIVE
Glucose, UA: NEGATIVE
Ketones, UA: NEGATIVE
Nitrite, UA: NEGATIVE
Protein, UA: NEGATIVE
Spec Grav, UA: 1.01 (ref 1.010–1.025)
Urobilinogen, UA: 0.2 E.U./dL
pH, UA: 5 (ref 5.0–8.0)

## 2021-04-06 MED ORDER — SULFAMETHOXAZOLE-TRIMETHOPRIM 800-160 MG PO TABS: 1.0000 | ORAL_TABLET | Freq: Two times a day (BID) | ORAL | 0 refills | Status: AC

## 2021-04-06 MED ORDER — PROCTOFOAM HC 1-1 % EX FOAM: 1.0000 | Freq: Two times a day (BID) | CUTANEOUS | 1 refills | Status: DC

## 2021-04-06 NOTE — Progress Notes (Signed)
HPI:      Ms. Bridgit Klima is a 65 y.o. G1P0010 who LMP was No LMP recorded. Patient has had a hysterectomy., presents today for a problem visit.    Urinary Tract Infection: Patient complains of dysuria . She has had symptoms for 3 days. Patient also complains of  frequency and urgencey . Patient denies fever. Patient does not have a history of recurrent UTI.  Patient does not have a history of pyelonephritis.   Urinary Incontinence: She also reports several month h/o of noted wet underclothes noticed at times, not having aggressive or large amount leakage but enough to notice.  She has rare GSI w vigorous cough only.  She has no nocturia.  Her urinary freq is q 2-3 hours.  No rash or odor.  PMHx: She  has a past medical history of Anemia, DDD (degenerative disc disease), cervical, Diabetes mellitus without complication ([place]), Duodenal ulcer, Family history of adverse reaction to anesthesia, Fibrocystic breast disease, Gall stone, GERD (gastroesophageal reflux disease), Herpes zoster, Hypercholesterolemia, Hypertension, Hyperthyroidism, PONV (postoperative nausea and vomiting), Shingles, Skin cancer (11/2018), Sleep apnea, Vitamin B 12 deficiency, and Vitamin D deficiency. Also,  has a past surgical history that includes Mandible surgery; Ectopic pregnancy surgery; Colonoscopy with propofol (N/A, 07/06/2016); Wisdom tooth extraction; Abdominal hysterectomy; Breast excisional biopsy (Right, 09/12/1990); Breast biopsy (Right, 09/13/2018); Breast biopsy (Right, 09/13/2018); Breast biopsy (Right, 09/27/2018); Breast biopsy (Right, 11/19/2018); Breast lumpectomy (Right, 11/19/2018); and Hand surgery., family history includes Bladder Cancer in her father; Bone cancer (age of onset: 32) in her maternal aunt; Cancer in her maternal aunt and maternal grandmother; Diabetes in her mother; Hyperthyroidism in her mother; Prostate cancer in her father.,  reports that she has never smoked. She has never used smokeless  tobacco. She reports that she does not drink alcohol and does not use drugs.  She has a current medication list which includes the following prescription(s): vitamin c, aspirin, atorvastatin, calcium carbonate-vit d-min, cyanocobalamin, ergocalciferol, glucose blood, insulin glargine (2 unit dial), insulin lispro, insulin pen needle, losartan, melatonin, nystatin cream, omeprazole, and polyethyl glycol-propyl glycol. Also, is allergic to codeine.  Review of Systems  Constitutional:  Negative for chills, fever and malaise/fatigue.  HENT:  Negative for congestion, sinus pain and sore throat.   Eyes:  Negative for blurred vision and pain.  Respiratory:  Negative for cough and wheezing.   Cardiovascular:  Negative for chest pain and leg swelling.  Gastrointestinal:  Negative for abdominal pain, constipation, diarrhea, heartburn, nausea and vomiting.  Genitourinary:  Positive for frequency and urgency. Negative for dysuria and hematuria.  Musculoskeletal:  Negative for back pain, joint pain, myalgias and neck pain.  Skin:  Negative for itching and rash.  Neurological:  Negative for dizziness, tremors and weakness.  Endo/Heme/Allergies:  Does not bruise/bleed easily.  Psychiatric/Behavioral:  Negative for depression. The patient is not nervous/anxious and does not have insomnia.    Objective: BP 130/80   Ht '5\' 8"'$  (1.727 m)   Wt 234 lb (106.1 kg)   BMI 35.58 kg/m  Physical Exam Constitutional:      General: She is not in acute distress.    Appearance: She is well-developed.  Genitourinary:     Genitourinary Comments: Cuff intact/ no lesions  Absent uterus and cervix     No vaginal erythema or bleeding.  Rectum:     External hemorrhoid present.     Rectal exam comments: Small ext hem.  HENT:     Head: Normocephalic and atraumatic.  Nose: Nose normal.  Abdominal:     General: There is no distension.     Palpations: Abdomen is soft.     Tenderness: There is no abdominal tenderness.   Musculoskeletal:        General: Normal range of motion.  Neurological:     Mental Status: She is alert and oriented to person, place, and time.     Cranial Nerves: No cranial nerve deficit.  Skin:    General: Skin is warm and dry.  Psychiatric:        Attention and Perception: Attention normal.        Mood and Affect: Mood normal.        Speech: Speech normal.        Behavior: Behavior normal.        Cognition and Memory: Cognition normal.        Judgment: Judgment normal.    ASSESSMENT/PLAN:     Problem List Items Addressed This Visit       Other   Urge incontinence of urine   Other Visit Diagnoses     Dysuria    -  Primary   Relevant Orders   POCT urinalysis dipstick (Completed)   Acute cystitis with hematuria         Tx UTI w Bactrim.  Monitor for recurrent UTI.  Lifestyle measures discussed  Monitor and diary incontinence sx's.  Options for tx discussed.  Proctofoam for hemorrhoids as OTC meds not helping.  Not in need of referral at this time.  Barnett Applebaum, MD, [facility], [facility] 04/06/2021  2:34 PM

## 2021-04-08 LAB — CYTOLOGY - PAP: Diagnosis: NEGATIVE

## 2021-05-27 ENCOUNTER — Other Ambulatory Visit: Payer: Self-pay | Admitting: Obstetrics and Gynecology

## 2021-05-27 DIAGNOSIS — N898 Other specified noninflammatory disorders of vagina: Secondary | ICD-10-CM

## 2021-07-21 ENCOUNTER — Other Ambulatory Visit: Payer: Self-pay | Admitting: General Surgery

## 2021-07-21 DIAGNOSIS — N6099 Unspecified benign mammary dysplasia of unspecified breast: Secondary | ICD-10-CM

## 2021-07-21 DIAGNOSIS — Z1231 Encounter for screening mammogram for malignant neoplasm of breast: Secondary | ICD-10-CM

## 2021-08-13 ENCOUNTER — Encounter: Payer: Self-pay | Admitting: *Deleted

## 2021-08-16 ENCOUNTER — Encounter
Admission: RE | Disposition: A | Discharge status: Home / Self Care | Payer: Self-pay | Source: Home / Self Care | Attending: Gastroenterology

## 2021-08-16 ENCOUNTER — Ambulatory Visit
Admission: RE | Admit: 2021-08-16 | Discharge: 2021-08-16 | Disposition: A | Discharge status: Home / Self Care | Payer: Medicare Other | Attending: Gastroenterology | Admitting: Gastroenterology

## 2021-08-16 ENCOUNTER — Encounter: Payer: Self-pay | Admitting: *Deleted

## 2021-08-16 ENCOUNTER — Ambulatory Visit: Payer: Medicare Other | Admitting: Anesthesiology

## 2021-08-16 DIAGNOSIS — K5730 Diverticulosis of large intestine without perforation or abscess without bleeding: Secondary | ICD-10-CM | POA: Diagnosis not present

## 2021-08-16 DIAGNOSIS — Z86010 Personal history of colonic polyps: Secondary | ICD-10-CM | POA: Diagnosis not present

## 2021-08-16 DIAGNOSIS — Z1211 Encounter for screening for malignant neoplasm of colon: Secondary | ICD-10-CM | POA: Insufficient documentation

## 2021-08-16 DIAGNOSIS — Z85828 Personal history of other malignant neoplasm of skin: Secondary | ICD-10-CM | POA: Insufficient documentation

## 2021-08-16 DIAGNOSIS — Z6837 Body mass index (BMI) 37.0-37.9, adult: Secondary | ICD-10-CM | POA: Diagnosis not present

## 2021-08-16 DIAGNOSIS — Z794 Long term (current) use of insulin: Secondary | ICD-10-CM | POA: Diagnosis not present

## 2021-08-16 DIAGNOSIS — E109 Type 1 diabetes mellitus without complications: Secondary | ICD-10-CM | POA: Insufficient documentation

## 2021-08-16 DIAGNOSIS — E785 Hyperlipidemia, unspecified: Secondary | ICD-10-CM | POA: Diagnosis not present

## 2021-08-16 DIAGNOSIS — E559 Vitamin D deficiency, unspecified: Secondary | ICD-10-CM | POA: Insufficient documentation

## 2021-08-16 DIAGNOSIS — K219 Gastro-esophageal reflux disease without esophagitis: Secondary | ICD-10-CM | POA: Diagnosis not present

## 2021-08-16 DIAGNOSIS — Z79899 Other long term (current) drug therapy: Secondary | ICD-10-CM | POA: Diagnosis not present

## 2021-08-16 DIAGNOSIS — E538 Deficiency of other specified B group vitamins: Secondary | ICD-10-CM | POA: Diagnosis not present

## 2021-08-16 DIAGNOSIS — K640 First degree hemorrhoids: Secondary | ICD-10-CM | POA: Insufficient documentation

## 2021-08-16 DIAGNOSIS — I10 Essential (primary) hypertension: Secondary | ICD-10-CM | POA: Diagnosis not present

## 2021-08-16 DIAGNOSIS — E7800 Pure hypercholesterolemia, unspecified: Secondary | ICD-10-CM | POA: Diagnosis not present

## 2021-08-16 HISTORY — PX: COLONOSCOPY: SHX5424

## 2021-08-16 LAB — GLUCOSE, CAPILLARY: Glucose-Capillary: 224 mg/dL — ABNORMAL HIGH (ref 70–99)

## 2021-08-16 SURGERY — COLONOSCOPY
Anesthesia: General

## 2021-08-16 MED ORDER — LIDOCAINE HCL (CARDIAC) PF 100 MG/5ML IV SOSY
PREFILLED_SYRINGE | INTRAVENOUS | Status: DC | PRN
  Administered 2021-08-16: 50 mg via INTRAVENOUS

## 2021-08-16 MED ORDER — MIDAZOLAM HCL 2 MG/2ML IJ SOLN
INTRAMUSCULAR | Status: AC
  Filled 2021-08-16: qty 2

## 2021-08-16 MED ORDER — FENTANYL CITRATE (PF) 100 MCG/2ML IJ SOLN
INTRAMUSCULAR | Status: AC
  Filled 2021-08-16: qty 2

## 2021-08-16 MED ORDER — MIDAZOLAM HCL 2 MG/2ML IJ SOLN
INTRAMUSCULAR | Status: DC | PRN
  Administered 2021-08-16: 2 mg via INTRAVENOUS

## 2021-08-16 MED ORDER — FENTANYL CITRATE (PF) 100 MCG/2ML IJ SOLN
INTRAMUSCULAR | Status: DC | PRN
  Administered 2021-08-16 (×4): 25 ug via INTRAVENOUS

## 2021-08-16 MED ORDER — LIDOCAINE HCL (PF) 1 % IJ SOLN
INTRAMUSCULAR | Status: AC
  Filled 2021-08-16: qty 2

## 2021-08-16 MED ORDER — PROPOFOL 500 MG/50ML IV EMUL
INTRAVENOUS | Status: DC | PRN
  Administered 2021-08-16: 50 ug/kg/min via INTRAVENOUS

## 2021-08-16 MED ORDER — PROPOFOL 10 MG/ML IV BOLUS
INTRAVENOUS | Status: DC | PRN
  Administered 2021-08-16: 20 mg via INTRAVENOUS
  Administered 2021-08-16: 30 mg via INTRAVENOUS

## 2021-08-16 MED ORDER — PROPOFOL 500 MG/50ML IV EMUL
INTRAVENOUS | Status: AC
  Filled 2021-08-16: qty 50

## 2021-08-16 MED ORDER — LIDOCAINE HCL (PF) 2 % IJ SOLN
INTRAMUSCULAR | Status: AC
  Filled 2021-08-16: qty 10

## 2021-08-16 MED ORDER — SODIUM CHLORIDE 0.9 % IV SOLN: INTRAVENOUS | Status: DC

## 2021-08-16 NOTE — H&P (Signed)
Outpatient short stay form Pre-procedure 08/16/2021  Lesly Rubenstein, MD  Primary Physician: Kirk Ruths, MD  Reason for visit:  Surveillance  History of present illness:   65 y/o lady with history of polyps here for colonoscopy. Has history of hypertension and HLD. No family history of GI malignancies. No blood thinners. History of hysterectomy.    Current Facility-Administered Medications:    0.9 %  sodium chloride infusion, , Intravenous, Continuous, Evie Croston, Hilton Cork, MD, Last Rate: 20 mL/hr at 08/16/21 0730, New Bag at 08/16/21 0730   lidocaine (PF) (XYLOCAINE) 1 % injection, , , ,   Medications Prior to Admission  Medication Sig Dispense Refill Last Dose   ASPIRIN 81 PO Take 81 mg by mouth daily.    08/15/2021   atorvastatin (LIPITOR) 40 MG tablet Take 40 mg by mouth at bedtime.    08/15/2021   Calcium Carbonate-Vit D-Min (CALCIUM 1200 PO) Take 1,200 mg by mouth daily.   Past Week   cyanocobalamin 1000 MCG tablet Take 1,000 mcg by mouth daily.   Past Week   ergocalciferol (VITAMIN D2) 50000 units capsule Take 50,000 Units by mouth once a week.   Past Week   insulin glargine, 2 Unit Dial, (TOUJEO MAX) 300 UNIT/ML Solostar Pen Inject 68 Units into the skin at bedtime.   Past Week   losartan (COZAAR) 50 MG tablet Take 50 mg by mouth daily.   08/16/2021   omeprazole (PRILOSEC) 20 MG capsule Take 20 mg by mouth daily.    08/15/2021   Ascorbic Acid (VITAMIN C) 1000 MG tablet Take 1,000 mg by mouth daily.       glucose blood test strip Use 4 (four) times daily. Use as instructed.      hydrocortisone-pramoxine (PROCTOFOAM HC) rectal foam Place 1 applicator rectally 2 (two) times daily. 10 g 1    insulin lispro (HUMALOG) 100 UNIT/ML injection Inject 12-20 Units into the skin 3 (three) times daily before meals.      Insulin Pen Needle 31G X 5 MM MISC USE WITH INSULIN      Melatonin 3 MG TABS Take 6 mg by mouth at bedtime.       nystatin cream (MYCOSTATIN) APPLY TOPICALLY TO THE  AFFECTED AREA TWICE DAILY AS NEEDED 30 g 0    Polyethyl Glycol-Propyl Glycol (SYSTANE OP) Place 1 drop into both eyes 2 (two) times daily as needed (dry eyes).        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    Physical Exam  Gen: Alert, oriented. Appears stated age.  HEENT: PERRLA. Lungs: No respiratory distress CV: RRR Abd: soft, benign, no masses Ext: No edema    Planned procedures: Proceed with  colonoscopy. The patient understands the nature of the planned procedure, indications, risks, alternatives and potential complications including but not limited to bleeding, infection, perforation, damage to internal organs and possible oversedation/side effects from anesthesia. The patient agrees and gives consent to proceed.  Please refer to procedure notes for findings, recommendations and patient disposition/instructions.     Lesly Rubenstein, MD [facility] Gastroenterology

## 2021-08-16 NOTE — Op Note (Signed)
[facility] Gastroenterology Patient Name: Terry Hickman Procedure Date: 08/16/2021 7:45 AM MRN: [id number] Account #: [id number] Date of Birth: 12/15/1955 Admit Type: Outpatient Age: 65 Room: [facility] ENDO ROOM 3 Gender: Female Note Status: Finalized Instrument Name: Jasper Riling [id number] Procedure:             Colonoscopy Indications:           High risk colon cancer surveillance: Personal history                         of non-advanced adenoma, Last colonoscopy 5 years ago Providers:             Andrey Farmer MD, MD Referring MD:          Ocie Cornfield. Ouida Sills MD, MD (Referring MD) Medicines:             Monitored Anesthesia Care Complications:         No immediate complications. Procedure:             Pre-Anesthesia Assessment:                        - Prior to the procedure, a History and Physical was                         performed, and patient medications and allergies were                         reviewed. The patient is competent. The risks and                         benefits of the procedure and the sedation options and                         risks were discussed with the patient. All questions                         were answered and informed consent was obtained.                         Patient identification and proposed procedure were                         verified by the physician, the nurse, the anesthetist                         and the technician in the endoscopy suite. Mental                         Status Examination: alert and oriented. Airway                         Examination: normal oropharyngeal airway and neck                         mobility. Respiratory Examination: clear to                         auscultation. CV Examination: normal. Prophylactic  Antibiotics: The patient does not require prophylactic                         antibiotics. Prior Anticoagulants: The patient has                         taken no  previous anticoagulant or antiplatelet agents                         except for aspirin. ASA Grade Assessment: II - A                         patient with mild systemic disease. After reviewing                         the risks and benefits, the patient was deemed in                         satisfactory condition to undergo the procedure. The                         anesthesia plan was to use monitored anesthesia care                         (MAC). Immediately prior to administration of                         medications, the patient was re-assessed for adequacy                         to receive sedatives. The heart rate, respiratory                         rate, oxygen saturations, blood pressure, adequacy of                         pulmonary ventilation, and response to care were                         monitored throughout the procedure. The physical                         status of the patient was re-assessed after the                         procedure.                        After obtaining informed consent, the colonoscope was                         passed under direct vision. Throughout the procedure,                         the patient's blood pressure, pulse, and oxygen                         saturations were monitored continuously. The  Colonoscope was introduced through the anus and                         advanced to the the cecum, identified by appendiceal                         orifice and ileocecal valve. The colonoscopy was                         performed without difficulty. The patient tolerated                         the procedure well. The quality of the bowel                         preparation was good. Findings:      The perianal and digital rectal examinations were normal.      A few small-mouthed diverticula were found in the sigmoid colon and       descending colon.      Internal hemorrhoids were found during retroflexion. The  hemorrhoids       were Grade I (internal hemorrhoids that do not prolapse).      The exam was otherwise without abnormality on direct and retroflexion       views. Impression:            - Diverticulosis in the sigmoid colon and in the                         descending colon.                        - Internal hemorrhoids.                        - The examination was otherwise normal on direct and                         retroflexion views.                        - No specimens collected. Recommendation:        - Discharge patient to home.                        - Resume previous diet.                        - Continue present medications.                        - Repeat colonoscopy in 10 years for surveillance.                        - Return to referring physician as previously                         scheduled. Procedure Code(s):     --- Professional ---                        N4709, Colorectal cancer screening; colonoscopy on  individual at high risk Diagnosis Code(s):     --- Professional ---                        Z86.010, Personal history of colonic polyps                        K64.0, First degree hemorrhoids                        K57.30, Diverticulosis of large intestine without                         perforation or abscess without bleeding CPT copyright 2019 American Medical Association. All rights reserved. The codes documented in this report are preliminary and upon coder review may  be revised to meet current compliance requirements. Andrey Farmer MD, MD 08/16/2021 8:12:45 AM Number of Addenda: 0 Note Initiated On: 08/16/2021 7:45 AM Scope Withdrawal Time: 0 hours 9 minutes 32 seconds  Total Procedure Duration: 0 hours 15 minutes 34 seconds  Estimated Blood Loss:  Estimated blood loss: none.      [facility]

## 2021-08-16 NOTE — Anesthesia Postprocedure Evaluation (Signed)
Anesthesia Post Note  Patient: Terry Hickman  Procedure(s) Performed: COLONOSCOPY  Patient location during evaluation: PACU Anesthesia Type: General Level of consciousness: awake and alert Pain management: pain level controlled Vital Signs Assessment: post-procedure vital signs reviewed and stable Respiratory status: spontaneous breathing, nonlabored ventilation, respiratory function stable and patient connected to nasal cannula oxygen Cardiovascular status: blood pressure returned to baseline and stable Postop Assessment: no apparent nausea or vomiting Anesthetic complications: no   No notable events documented.   Last Vitals:  Vitals:   08/16/21 0820 08/16/21 0830  BP: (!) 118/52 (!) 108/39  Pulse: 71 64  Resp: 15 (!) 22  Temp:    SpO2: 100% 91%    Last Pain:  Vitals:   08/16/21 0830  TempSrc:   PainSc: 0-No pain                 Molli Barrows

## 2021-08-16 NOTE — Anesthesia Preprocedure Evaluation (Signed)
Anesthesia Evaluation  Patient identified by MRN, date of birth, ID band Patient awake    Reviewed: Allergy & Precautions, H&P , NPO status , Patient's Chart, lab work & pertinent test results, reviewed documented beta blocker date and time   History of Anesthesia Complications (+) PONV, Family history of anesthesia reaction and history of anesthetic complications  Airway Mallampati: III   Neck ROM: full    Dental  (+) Teeth Intact   Pulmonary sleep apnea and Continuous Positive Airway Pressure Ventilation ,    Pulmonary exam normal        Cardiovascular Exercise Tolerance: Poor hypertension, On Medications negative cardio ROS Normal cardiovascular exam Rhythm:regular Rate:Normal     Neuro/Psych  Headaches, negative psych ROS   GI/Hepatic Neg liver ROS, PUD, GERD  Medicated,  Endo/Other  diabetes, Well Controlled, Type 1, Insulin DependentHyperthyroidism Morbid obesity  Renal/GU negative Renal ROS  negative genitourinary   Musculoskeletal   Abdominal   Peds  Hematology  (+) Blood dyscrasia, anemia ,   Anesthesia Other Findings Past Medical History: No date: Anemia No date: DDD (degenerative disc disease), cervical No date: Diabetes mellitus without complication (HCC) No date: Duodenal ulcer No date: Family history of adverse reaction to anesthesia     Comment:  mother = PONV No date: Fibrocystic breast disease No date: Gall stone No date: GERD (gastroesophageal reflux disease) No date: Herpes zoster No date: Hypercholesterolemia No date: Hypertension No date: Hyperthyroidism No date: PONV (postoperative nausea and vomiting) No date: Shingles 11/2018: Skin cancer     Comment:  squamous cell, RLE No date: Sleep apnea No date: Vitamin B 12 deficiency No date: Vitamin D deficiency Past Surgical History: No date: ABDOMINAL HYSTERECTOMY 09/13/2018: BREAST BIOPSY; Right     Comment:  Papilloma, [place]  bx 09/13/2018: BREAST BIOPSY; Right     Comment:  Papilloma, [place] bx 09/27/2018: BREAST BIOPSY; Right     Comment:  affirm bx of mass, path pending x marker 11/19/2018: BREAST BIOPSY; Right     Comment:  Procedure: EXCISION RIGHT BREAST PAPILLOMA WITH NEEDLE               LOCALIZATION X2 10:30 START PER MAMMO;  Surgeon: Robert Bellow, MD;  Location: [facility] ORS;  Service: General;                Laterality: Right; 09/12/1990: BREAST EXCISIONAL BIOPSY; Right     Comment:  negative 11/19/2018: BREAST LUMPECTOMY; Right     Comment:  papilloma 07/06/2016: COLONOSCOPY WITH PROPOFOL; N/A     Comment:  Procedure: COLONOSCOPY WITH PROPOFOL;  Surgeon: Manya Silvas, MD;  Location: [facility] ENDOSCOPY;  Service:               Endoscopy;  Laterality: N/A; No date: ECTOPIC PREGNANCY SURGERY No date: HAND SURGERY No date: MANDIBLE SURGERY No date: WISDOM TOOTH EXTRACTION BMI    Body Mass Index: 37.40 kg/m     Reproductive/Obstetrics negative OB ROS                             Anesthesia Physical Anesthesia Plan  ASA: 3  Anesthesia Plan: General   Post-op Pain Management:    Induction:   PONV Risk Score and Plan:   Airway Management Planned:   Additional Equipment:   Intra-op  Plan:   Post-operative Plan:   Informed Consent: I have reviewed the patients History and Physical, chart, labs and discussed the procedure including the risks, benefits and alternatives for the proposed anesthesia with the patient or authorized representative who has indicated his/her understanding and acceptance.     Dental Advisory Given  Plan Discussed with: CRNA  Anesthesia Plan Comments:         Anesthesia Quick Evaluation

## 2021-08-16 NOTE — Transfer of Care (Signed)
Immediate Anesthesia Transfer of Care Note  Patient: Terry Hickman  Procedure(s) Performed: COLONOSCOPY  Patient Location: PACU  Anesthesia Type:General  Level of Consciousness: sedated  Airway & Oxygen Therapy: Patient Spontanous Breathing and Patient connected to nasal cannula oxygen  Post-op Assessment: Report given to RN and Post -op Vital signs reviewed and stable  Post vital signs: Reviewed and stable  Last Vitals:  Vitals Value Taken Time  BP 111/70 08/16/21 0810  Temp    Pulse 71 08/16/21 0811  Resp 15 08/16/21 0811  SpO2 98 % 08/16/21 0811  Vitals shown include unvalidated device data.  Last Pain:  Vitals:   08/16/21 0810  TempSrc:   PainSc: Asleep         Complications: No notable events documented.

## 2021-08-16 NOTE — Interval H&P Note (Signed)
History and Physical Interval Note:  08/16/2021 7:45 AM  Terry Hickman  has presented today for surgery, with the diagnosis of COLON CANCER SCREENING.  The various methods of treatment have been discussed with the patient and family. After consideration of risks, benefits and other options for treatment, the patient has consented to  Procedure(s) with comments: COLONOSCOPY (N/A) - IDDM as a surgical intervention.  The patient's history has been reviewed, patient examined, no change in status, stable for surgery.  I have reviewed the patient's chart and labs.  Questions were answered to the patient's satisfaction.     Lesly Rubenstein  Ok to proceed with colonoscopy

## 2021-08-17 ENCOUNTER — Encounter: Payer: Self-pay | Admitting: Gastroenterology

## 2021-08-26 ENCOUNTER — Ambulatory Visit
Admission: RE | Admit: 2021-08-26 | Discharge: 2021-08-26 | Disposition: A | Discharge status: Home / Self Care | Payer: Medicare Other | Source: Ambulatory Visit | Attending: General Surgery | Admitting: General Surgery

## 2021-08-26 ENCOUNTER — Other Ambulatory Visit: Payer: Self-pay

## 2021-08-26 DIAGNOSIS — Z1231 Encounter for screening mammogram for malignant neoplasm of breast: Secondary | ICD-10-CM | POA: Insufficient documentation

## 2021-09-02 ENCOUNTER — Encounter: Payer: Self-pay | Admitting: Obstetrics & Gynecology

## 2021-09-02 ENCOUNTER — Other Ambulatory Visit: Payer: Self-pay

## 2021-09-02 ENCOUNTER — Ambulatory Visit (INDEPENDENT_AMBULATORY_CARE_PROVIDER_SITE_OTHER): Payer: Medicare Other | Admitting: Obstetrics and Gynecology

## 2021-09-02 ENCOUNTER — Encounter: Payer: Self-pay | Admitting: Obstetrics and Gynecology

## 2021-09-02 VITALS — BP 142/90 | Ht 68.0 in | Wt 251.0 lb

## 2021-09-02 DIAGNOSIS — B354 Tinea corporis: Secondary | ICD-10-CM

## 2021-09-02 MED ORDER — CLOTRIMAZOLE-BETAMETHASONE 1-0.05 % EX CREA: TOPICAL_CREAM | CUTANEOUS | 0 refills | Status: DC

## 2021-09-02 MED ORDER — FLUCONAZOLE 150 MG PO TABS: 150.0000 mg | ORAL_TABLET | Freq: Once | ORAL | 0 refills | Status: AC

## 2021-09-02 NOTE — Progress Notes (Signed)
Kirk Ruths, MD   Chief Complaint  Patient presents with   Rash    On incision, itchy, pain at times     HPI:      Terry Hickman is a 65 y.o. G1P0010 whose LMP was No LMP recorded. Patient has had a hysterectomy., presents today for itching, redness on hyst scar/panus area. Has occas redness at incision site for 1-2 yrs, treats with bacitracin with sx relief. Sx worse this time, not improving. Hx of DM, hx of panus.Tries to keep area dry.    Patient Active Problem List   Diagnosis Date Noted   Urge incontinence of urine 04/06/2021   Vitamin D deficiency disease 10/18/2018   Lymphedema 11/29/2017   Health care maintenance 11/18/2015   Essential hypertension 11/17/2014   Morning headache 02/01/2013   Severe obesity (BMI 35.0-35.9 with comorbidity) ([place]) 02/01/2013   Snoring 02/01/2013   Sleep apnea 12/06/2012   Back pain 06/28/2012   Gall stones 04/23/2012   High risk medication use 12/22/2011   History of Graves' disease 12/22/2011   History of iron deficiency 12/22/2011   History of non anemic vitamin B12 deficiency 12/22/2011   Diabetes mellitus type 1, uncontrolled 07/13/2011   GERD (gastroesophageal reflux disease) 07/13/2011   Hypercholesterolemia 07/13/2011    Past Surgical History:  Procedure Laterality Date   ABDOMINAL HYSTERECTOMY     BREAST BIOPSY Right 09/13/2018   Papilloma, [place] bx   BREAST BIOPSY Right 09/13/2018   Papilloma, [place] bx   BREAST BIOPSY Right 09/27/2018   affirm bx of mass,  x clip, benign   BREAST BIOPSY Right 11/19/2018   Procedure: EXCISION RIGHT BREAST PAPILLOMA WITH NEEDLE LOCALIZATION X2 10:30 START PER MAMMO;  Surgeon: Robert Bellow, MD;  Location: [facility] ORS;  Service: General;  Laterality: Right;   BREAST EXCISIONAL BIOPSY Right 09/12/1990   negative   BREAST LUMPECTOMY Right 11/19/2018   papilloma   COLONOSCOPY N/A 08/16/2021   Procedure: COLONOSCOPY;  Surgeon: Lesly Rubenstein, MD;  Location: [facility]  ENDOSCOPY;  Service: Endoscopy;  Laterality: N/A;  IDDM   COLONOSCOPY WITH PROPOFOL N/A 07/06/2016   Procedure: COLONOSCOPY WITH PROPOFOL;  Surgeon: Manya Silvas, MD;  Location: [facility] ENDOSCOPY;  Service: Endoscopy;  Laterality: N/A;   ECTOPIC PREGNANCY SURGERY     HAND SURGERY     MANDIBLE SURGERY     WISDOM TOOTH EXTRACTION      Family History  Problem Relation Age of Onset   Bladder Cancer Father    Prostate cancer Father    Bone cancer Maternal Aunt 67   Cancer Maternal Aunt    Diabetes Mother    Hyperthyroidism Mother    Cancer Maternal Grandmother    Breast cancer Neg Hx     Social History   Socioeconomic History   Marital status: Married    Spouse name: Not on file   Number of children: Not on file   Years of education: Not on file   Highest education level: Not on file  Occupational History   Not on file  Tobacco Use   Smoking status: Never   Smokeless tobacco: Never  Vaping Use   Vaping Use: Never used  Substance and Sexual Activity   Alcohol use: No   Drug use: No   Sexual activity: Yes  Other Topics Concern   Not on file  Social History Narrative   Not on file   Social Determinants of Health   Financial Resource Strain: Not on  file  Food Insecurity: Not on file  Transportation Needs: Not on file  Physical Activity: Not on file  Stress: Not on file  Social Connections: Not on file  Intimate Partner Violence: Not on file    Outpatient Medications Prior to Visit  Medication Sig Dispense Refill   Ascorbic Acid (VITAMIN C) 1000 MG tablet Take 1,000 mg by mouth daily.      ASPIRIN 81 PO Take 81 mg by mouth daily.      atorvastatin (LIPITOR) 40 MG tablet Take 40 mg by mouth at bedtime.      Calcium Carbonate-Vit D-Min (CALCIUM 1200 PO) Take 1,200 mg by mouth daily.     cyanocobalamin 1000 MCG tablet Take 1,000 mcg by mouth daily.     ergocalciferol (VITAMIN D2) 50000 units capsule Take 50,000 Units by mouth once a week.     glucose blood test  strip Use 4 (four) times daily. Use as instructed.     hydrocortisone-pramoxine (PROCTOFOAM HC) rectal foam Place 1 applicator rectally 2 (two) times daily. 10 g 1   insulin glargine, 2 Unit Dial, (TOUJEO MAX) 300 UNIT/ML Solostar Pen Inject 68 Units into the skin at bedtime.     insulin lispro (HUMALOG) 100 UNIT/ML injection Inject 12-20 Units into the skin 3 (three) times daily before meals.     Insulin Pen Needle 31G X 5 MM MISC USE WITH INSULIN     losartan (COZAAR) 50 MG tablet Take 50 mg by mouth daily.     Melatonin 3 MG TABS Take 6 mg by mouth at bedtime.      nystatin cream (MYCOSTATIN) APPLY TOPICALLY TO THE AFFECTED AREA TWICE DAILY AS NEEDED 30 g 0   omeprazole (PRILOSEC) 20 MG capsule Take 20 mg by mouth daily.      Polyethyl Glycol-Propyl Glycol (SYSTANE OP) Place 1 drop into both eyes 2 (two) times daily as needed (dry eyes).     No facility-administered medications prior to visit.      ROS:  Review of Systems  Constitutional:  Negative for fever.  Gastrointestinal:  Negative for blood in stool, constipation, diarrhea, nausea and vomiting.  Genitourinary:  Negative for dyspareunia, dysuria, flank pain, frequency, hematuria, urgency, vaginal bleeding, vaginal discharge and vaginal pain.  Musculoskeletal:  Negative for back pain.  Skin:  Positive for rash.  BREAST: No symptoms   OBJECTIVE:   Vitals:  BP (!) 142/90    Ht 5\' 8"  (1.727 m)    Wt 251 lb (113.9 kg)    BMI 38.16 kg/m   Physical Exam Constitutional:      Appearance: Normal appearance.  Pulmonary:     Effort: Pulmonary effort is normal.  Abdominal:     Palpations: Abdomen is soft.    Musculoskeletal:        General: Normal range of motion.  Neurological:     Mental Status: She is alert and oriented to person, place, and time.  Psychiatric:        Judgment: Judgment normal.    Assessment/Plan: Tinea corporis - Plan: fluconazole (DIFLUCAN) 150 MG tablet, clotrimazole-betamethasone (LOTRISONE)  cream; Rx diflucan x 2, Rx lotrisone crm BID for 2 wks. If sx persist, will extend to 4 wks. Once treated, prevent with athlete's foot powder vs corn starch/keep dry/dry with hair dryer. F/u prn.    Meds ordered this encounter  Medications   fluconazole (DIFLUCAN) 150 MG tablet    Sig: Take 1 tablet (150 mg total) by mouth once for 1 dose. Repeat  after 1 wk    Dispense:  2 tablet    Refill:  0    Order Specific Question:   Supervising Provider    Answer:   Gae Dry [[id number]]   clotrimazole-betamethasone (LOTRISONE) cream    Sig: Apply externally BID for 2 wks    Dispense:  45 g    Refill:  0    Order Specific Question:   Supervising Provider    Answer:   Gae Dry [[id number]]      Return if symptoms worsen or fail to improve.  Kenn Rekowski B. Braeleigh Pyper, PA-C 09/02/2021 4:10 PM

## 2021-09-21 ENCOUNTER — Other Ambulatory Visit: Payer: Self-pay | Admitting: Obstetrics & Gynecology

## 2021-09-21 DIAGNOSIS — Z13820 Encounter for screening for osteoporosis: Secondary | ICD-10-CM

## 2021-09-21 DIAGNOSIS — Z09 Encounter for follow-up examination after completed treatment for conditions other than malignant neoplasm: Secondary | ICD-10-CM

## 2021-09-21 DIAGNOSIS — M8580 Other specified disorders of bone density and structure, unspecified site: Secondary | ICD-10-CM

## 2021-11-04 ENCOUNTER — Other Ambulatory Visit: Payer: Medicare Other

## 2021-11-15 ENCOUNTER — Other Ambulatory Visit: Payer: Self-pay | Admitting: Obstetrics & Gynecology

## 2021-11-15 DIAGNOSIS — E0590 Thyrotoxicosis, unspecified without thyrotoxic crisis or storm: Secondary | ICD-10-CM

## 2021-11-15 DIAGNOSIS — M816 Localized osteoporosis [Lequesne]: Secondary | ICD-10-CM

## 2021-11-15 DIAGNOSIS — N959 Unspecified menopausal and perimenopausal disorder: Secondary | ICD-10-CM

## 2021-11-15 DIAGNOSIS — M8580 Other specified disorders of bone density and structure, unspecified site: Secondary | ICD-10-CM

## 2021-11-16 ENCOUNTER — Other Ambulatory Visit: Payer: Medicare Other

## 2021-12-03 ENCOUNTER — Other Ambulatory Visit: Payer: Self-pay

## 2021-12-03 ENCOUNTER — Encounter: Payer: Self-pay | Admitting: Obstetrics and Gynecology

## 2021-12-03 ENCOUNTER — Ambulatory Visit (INDEPENDENT_AMBULATORY_CARE_PROVIDER_SITE_OTHER): Payer: Medicare Other | Admitting: Obstetrics and Gynecology

## 2021-12-03 VITALS — BP 130/80 | Ht 68.0 in | Wt 250.0 lb

## 2021-12-03 DIAGNOSIS — L089 Local infection of the skin and subcutaneous tissue, unspecified: Secondary | ICD-10-CM | POA: Diagnosis not present

## 2021-12-03 MED ORDER — SULFAMETHOXAZOLE-TRIMETHOPRIM 800-160 MG PO TABS: 1.0000 | ORAL_TABLET | Freq: Two times a day (BID) | ORAL | 0 refills | Status: AC

## 2021-12-03 NOTE — Progress Notes (Signed)
? ?Patient ID: Terry Hickman, female   DOB: 10-Mar-1956, 66 y.o.   MRN: [id number] ? ?Reason for Consult: vaginal abcess ?  ?Referred by Kirk Ruths, MD ? ?Subjective:  ?   ?HPI: ? ?Terry Hickman is a 66 y.o. female she reports that she recently noticed a red area of skin above her pubic bone.  She had infection here in a similar area approximately 3 months ago.  She is successfully treated with doxycycline.  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  This morning she noticed that it opened and started draining yellowish fluid which was seen on her underwear.  She has no fevers.  She not having any vaginal discharge. ? ?Gynecological History ? ?No LMP recorded. Patient has had a hysterectomy. ? ?Past Medical History:  ?Diagnosis Date  ? Anemia   ? DDD (degenerative disc disease), cervical   ? Diabetes mellitus without complication ([place])   ? Duodenal ulcer   ? Family history of adverse reaction to anesthesia   ? mother = PONV  ? Fibrocystic breast disease   ? Gall stone   ? GERD (gastroesophageal reflux disease)   ? Herpes zoster   ? Hypercholesterolemia   ? Hypertension   ? Hyperthyroidism   ? PONV (postoperative nausea and vomiting)   ? Shingles   ? 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 Sleep apnea   ? Vitamin B 12 deficiency   ? Vitamin D deficiency   ? ?Family History  ?Problem Relation Age of Onset  ? Bladder Cancer Father   ? Prostate cancer Father   ? Bone cancer Maternal Aunt 80  ? Cancer Maternal Aunt   ? Diabetes Mother   ? Hyperthyroidism Mother   ? Cancer Maternal Grandmother   ? Breast cancer Neg Hx   ? ?Past Surgical History:  ?Procedure Laterality Date  ? ABDOMINAL HYSTERECTOMY    ? BREAST BIOPSY Right 09/13/2018  ? Papilloma, [place] bx  ? BREAST BIOPSY Right 09/13/2018  ? Papilloma, [place] bx  ? BREAST BIOPSY Right 09/27/2018  ? affirm bx of mass,  x clip, benign  ? BREAST BIOPSY Right 11/19/2018  ? Procedure: EXCISION RIGHT BREAST PAPILLOMA WITH NEEDLE LOCALIZATION X2 10:30 START PER  MAMMO;  Surgeon: Robert Bellow, MD;  Location: [facility] ORS;  Service: General;  Laterality: Right;  ? BREAST EXCISIONAL BIOPSY Right 09/12/1990  ? negative  ? BREAST LUMPECTOMY Right 11/19/2018  ? papilloma  ? COLONOSCOPY N/A 08/16/2021  ? Procedure: COLONOSCOPY;  Surgeon: Lesly Rubenstein, MD;  Location: [facility] ENDOSCOPY;  Service: Endoscopy;  Laterality: N/A;  IDDM  ? COLONOSCOPY WITH PROPOFOL N/A 07/06/2016  ? Procedure: COLONOSCOPY WITH PROPOFOL;  Surgeon: Manya Silvas, MD;  Location: [facility] ENDOSCOPY;  Service: Endoscopy;  Laterality: N/A;  ? ECTOPIC PREGNANCY SURGERY    ? HAND SURGERY    ? MANDIBLE SURGERY    ? WISDOM TOOTH EXTRACTION    ? ? ?Short Social History:  ?Social History  ? ?Tobacco Use  ? Smoking status: Never  ? Smokeless tobacco: Never  ?Substance Use Topics  ? Alcohol use: No  ? ? ?Allergies  ?Allergen Reactions  ? Codeine Nausea Only  ? ? ?Current Outpatient Medications  ?Medication Sig Dispense Refill  ? Ascorbic Acid (VITAMIN C) 1000 MG tablet Take 1,000 mg by mouth daily.     ? ASPIRIN 81 PO Take 81 mg by mouth daily.     ? atorvastatin (LIPITOR) 40 MG tablet  Take 40 mg by mouth at bedtime.     ? Calcium Carbonate-Vit D-Min (CALCIUM 1200 PO) Take 1,200 mg by mouth daily.    ? clotrimazole-betamethasone (LOTRISONE) cream Apply externally BID for 2 wks 45 g 0  ? cyanocobalamin 1000 MCG tablet Take 1,000 mcg by mouth daily.    ? ergocalciferol (VITAMIN D2) 50000 units capsule Take 50,000 Units by mouth once a week.    ? glucose blood test strip Use 4 (four) times daily. Use as instructed.    ? hydrocortisone-pramoxine (PROCTOFOAM HC) rectal foam Place 1 applicator rectally 2 (two) times daily. 10 g 1  ? insulin glargine, 2 Unit Dial, (TOUJEO MAX) 300 UNIT/ML Solostar Pen Inject 68 Units into the skin at bedtime.    ? insulin lispro (HUMALOG) 100 UNIT/ML injection Inject 12-20 Units into the skin 3 (three) times daily before meals.    ? Insulin Pen Needle 31G X 5 MM MISC USE WITH INSULIN     ? losartan (COZAAR) 50 MG tablet Take 50 mg by mouth daily.    ? Melatonin 3 MG TABS Take 6 mg by mouth at bedtime.     ? nystatin cream (MYCOSTATIN) APPLY TOPICALLY TO THE AFFECTED AREA TWICE DAILY AS NEEDED 30 g 0  ? omeprazole (PRILOSEC) 20 MG capsule Take 20 mg by mouth daily.     ? Polyethyl Glycol-Propyl Glycol (SYSTANE OP) Place 1 drop into both eyes 2 (two) times daily as needed (dry eyes).    ? sulfamethoxazole-trimethoprim (BACTRIM DS) 800-160 MG tablet Take 1 tablet by mouth 2 (two) times daily for 10 days. 20 tablet 0  ? ?No current facility-administered medications for this visit.  ? ? ?Review of Systems  ?Constitutional: Negative for chills, fatigue, fever and unexpected weight change.  ?HENT: Negative for trouble swallowing.  ?Eyes: Negative for loss of vision.  ?Respiratory: Negative for cough, shortness of breath and wheezing.  ?Cardiovascular: Negative for chest pain, leg swelling, palpitations and syncope.  ?GI: Negative for abdominal pain, blood in stool, diarrhea, nausea and vomiting.  ?GU: Negative for difficulty urinating, dysuria, frequency and hematuria.  ?Musculoskeletal: Negative for back pain, leg pain and joint pain.  ?Skin: Negative for rash.  ?Neurological: Negative for dizziness, headaches, light-headedness, numbness and seizures.  ?Psychiatric: Negative for behavioral problem, confusion, depressed mood and sleep disturbance.   ? ?   ?Objective:  ?Objective  ? ?Vitals:  ? 12/03/21 1024  ?BP: 130/80  ?Weight: 250 lb (113.4 kg)  ?Height: '5\' 8"'$  (1.727 m)  ? ?Body mass index is 38.01 kg/m?. ? ?Physical Exam ?Vitals and nursing note reviewed. Exam conducted with a chaperone present.  ?Constitutional:   ?   Appearance: Normal appearance. She is well-developed.  ?HENT:  ?   Head: Normocephalic and atraumatic.  ?Eyes:  ?   Extraocular Movements: Extraocular movements intact.  ?   Pupils: Pupils are equal, round, and reactive to light.  ?Cardiovascular:  ?   Rate and Rhythm: Normal rate and  regular rhythm.  ?Pulmonary:  ?   Effort: Pulmonary effort is normal. No respiratory distress.  ?   Breath sounds: Normal breath sounds.  ?Abdominal:  ?   General: Abdomen is flat.  ?   Palpations: Abdomen is soft.  ?Genitourinary: ?   Comments: External: Normal appearing vulva. No lesions noted.  ? ?Musculoskeletal:     ?   General: No signs of injury.  ?Skin: ?   General: Skin is warm and dry.  ? ?    ?  Neurological:  ?   Mental Status: She is alert and oriented to person, place, and time.  ?Psychiatric:     ?   Behavior: Behavior normal.     ?   Thought Content: Thought content normal.     ?   Judgment: Judgment normal.  ? ? ?Assessment/Plan:  ?  ? ?66 year old with skin infection.   ?Will treat with Bactrim DS.  Encouraged her to wash the area with Hibiclens soap twice a day.  Follow-up in 1 week. ? ?More than 10 minutes were spent face to face with the patient in the room, reviewing the medical record, labs and images, and coordinating care for the patient. The plan of management was discussed in detail and counseling was provided.  ? ?  ? ?Adrian Prows MD ?[facility], [facility] ?12/03/2021 ?10:41 AM ? ? ?

## 2021-12-03 NOTE — Patient Instructions (Signed)
Hibiclens- Chlorhexidine Soap ? ? ?Cellulitis, Adult ?Cellulitis is a skin infection. The infected area is usually warm, red, swollen, and tender. This condition occurs most often in the arms and lower legs. The infection can travel to the muscles, blood, and underlying tissue and become serious. It is very important to get treated for this condition. ?What are the causes? ?Cellulitis is caused by bacteria. The bacteria enter through a break in the skin, such as a cut, burn, insect bite, open sore, or crack. ?What increases the risk? ?This condition is more likely to occur in people who: ?Have a weak body defense system (immune system). ?Have open wounds on the skin, such as cuts, burns, bites, and scrapes. Bacteria can enter the body through these open wounds. ?Are older than 68 years of age. ?Have diabetes. ?Have a type of long-lasting (chronic) liver disease (cirrhosis) or kidney disease. ?Are obese. ?Have a skin condition such as: ?Itchy rash (eczema). ?Slow movement of blood in the veins (venous stasis). ?Fluid buildup below the skin (edema). ?Have had radiation therapy. ?Use IV drugs. ?What are the signs or symptoms? ?Symptoms of this condition include: ?Redness, streaking, or spotting on the skin. ?Swollen area of the skin. ?Tenderness or pain when an area of the skin is touched. ?Warm skin. ?A fever. ?Chills. ?Blisters. ?How is this diagnosed? ?This condition is diagnosed based on a medical history and physical exam. You may also have tests, including: ?Blood tests. ?Imaging tests. ?How is this treated? ?Treatment for this condition may include: ?Medicines, such as antibiotic medicines or medicines to treat allergies (antihistamines). ?Supportive care, such as rest and application of cold or warm cloths (compresses) to the skin. ?Hospital care, if the condition is severe. ?The infection usually starts to get better within 1-2 days of treatment. ?Follow these instructions at home: ?Medicines ?Take  over-the-counter and prescription medicines only as told by your health care provider. ?If you were prescribed an antibiotic medicine, take it as told by your health care provider. Do not stop taking the antibiotic even if you start to feel better. ?General instructions ?Drink enough fluid to keep your urine pale yellow. ?Do not touch or rub the infected area. ?Raise (elevate) the infected area above the level of your heart while you are sitting or lying down. ?Apply warm or cold compresses to the affected area as told by your health care provider. ?Keep all follow-up visits as told by your health care provider. This is important. These visits let your health care provider make sure a more serious infection is not developing. ?Contact a health care provider if: ?You have a fever. ?Your symptoms do not begin to improve within 1-2 days of starting treatment. ?Your bone or joint underneath the infected area becomes painful after the skin has healed. ?Your infection returns in the same area or another area. ?You notice a swollen bump in the infected area. ?You develop new symptoms. ?You have a general ill feeling (malaise) with muscle aches and pains. ?Get help right away if: ?Your symptoms get worse. ?You feel very sleepy. ?You develop vomiting or diarrhea that persists. ?You notice red streaks coming from the infected area. ?Your red area gets larger or turns dark in color. ?These symptoms may represent a serious problem that is an emergency. Do not wait to see if the symptoms will go away. Get medical help right away. Call your local emergency services (911 in the [place] Do not drive yourself to the hospital. ?Summary ?Cellulitis is a skin infection. This  condition occurs most often in the arms and lower legs. ?Treatment for this condition may include medicines, such as antibiotic medicines or antihistamines. ?Take over-the-counter and prescription medicines only as told by your health care provider. If you were  prescribed an antibiotic medicine, do not stop taking the antibiotic even if you start to feel better. ?Contact a health care provider if your symptoms do not begin to improve within 1-2 days of starting treatment or your symptoms get worse. ?Keep all follow-up visits as told by your health care provider. This is important. These visits let your health care provider make sure that a more serious infection is not developing. ?This information is not intended to replace advice given to you by your health care provider. Make sure you discuss any questions you have with your health care provider. ?Document Revised: 09/09/2019 Document Reviewed: 01/18/2018 ?Elsevier Patient Education ? [place]. ? ?

## 2021-12-10 ENCOUNTER — Encounter: Payer: Self-pay | Admitting: Obstetrics and Gynecology

## 2021-12-10 ENCOUNTER — Ambulatory Visit (INDEPENDENT_AMBULATORY_CARE_PROVIDER_SITE_OTHER): Payer: Medicare Other | Admitting: Obstetrics and Gynecology

## 2021-12-10 VITALS — BP 110/70 | Ht 68.0 in | Wt 248.0 lb

## 2021-12-10 DIAGNOSIS — L089 Local infection of the skin and subcutaneous tissue, unspecified: Secondary | ICD-10-CM

## 2021-12-10 NOTE — Progress Notes (Signed)
? ?Patient ID: Terry Hickman, female   DOB: Nov 30, 1955, 66 y.o.   MRN: [id number] ? ?Reason for Consult: Follow-up (better) ?  ?Referred by Kirk Ruths, MD ? ?Subjective:  ?   ?HPI: ? ?Terry Hickman is a 66 y.o. female she is following up for evaluation of a skin infection on her mons pubis.  She reports that she has been taking her antibiotic.  She still has 3 days of this medication left.  She is washing with Hibiclens soap.  She can still see the skin lesion. ? ?Gynecological History ? ?No LMP recorded. Patient has had a hysterectomy. ?Past Medical History:  ?Diagnosis Date  ? Anemia   ? DDD (degenerative disc disease), cervical   ? Diabetes mellitus without complication ([place])   ? Duodenal ulcer   ? Family history of adverse reaction to anesthesia   ? mother = PONV  ? Fibrocystic breast disease   ? Gall stone   ? GERD (gastroesophageal reflux disease)   ? Herpes zoster   ? Hypercholesterolemia   ? Hypertension   ? Hyperthyroidism   ? PONV (postoperative nausea and vomiting)   ? Shingles   ? 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 Sleep apnea   ? Vitamin B 12 deficiency   ? Vitamin D deficiency   ? ?Family History  ?Problem Relation Age of Onset  ? Bladder Cancer Father   ? Prostate cancer Father   ? Bone cancer Maternal Aunt 80  ? Cancer Maternal Aunt   ? Diabetes Mother   ? Hyperthyroidism Mother   ? Cancer Maternal Grandmother   ? Breast cancer Neg Hx   ? ?Past Surgical History:  ?Procedure Laterality Date  ? ABDOMINAL HYSTERECTOMY    ? BREAST BIOPSY Right 09/13/2018  ? Papilloma, [place] bx  ? BREAST BIOPSY Right 09/13/2018  ? Papilloma, [place] bx  ? BREAST BIOPSY Right 09/27/2018  ? affirm bx of mass,  x clip, benign  ? BREAST BIOPSY Right 11/19/2018  ? Procedure: EXCISION RIGHT BREAST PAPILLOMA WITH NEEDLE LOCALIZATION X2 10:30 START PER MAMMO;  Surgeon: Robert Bellow, MD;  Location: [facility] ORS;  Service: General;  Laterality: Right;  ? BREAST EXCISIONAL BIOPSY Right 09/12/1990  ? negative   ? BREAST LUMPECTOMY Right 11/19/2018  ? papilloma  ? COLONOSCOPY N/A 08/16/2021  ? Procedure: COLONOSCOPY;  Surgeon: Lesly Rubenstein, MD;  Location: [facility] ENDOSCOPY;  Service: Endoscopy;  Laterality: N/A;  IDDM  ? COLONOSCOPY WITH PROPOFOL N/A 07/06/2016  ? Procedure: COLONOSCOPY WITH PROPOFOL;  Surgeon: Manya Silvas, MD;  Location: [facility] ENDOSCOPY;  Service: Endoscopy;  Laterality: N/A;  ? ECTOPIC PREGNANCY SURGERY    ? HAND SURGERY    ? MANDIBLE SURGERY    ? WISDOM TOOTH EXTRACTION    ? ? ?Short Social History:  ?Social History  ? ?Tobacco Use  ? Smoking status: Never  ? Smokeless tobacco: Never  ?Substance Use Topics  ? Alcohol use: No  ? ? ?Allergies  ?Allergen Reactions  ? Codeine Nausea Only  ? ? ?Current Outpatient Medications  ?Medication Sig Dispense Refill  ? Ascorbic Acid (VITAMIN C) 1000 MG tablet Take 1,000 mg by mouth daily.     ? ASPIRIN 81 PO Take 81 mg by mouth daily.     ? atorvastatin (LIPITOR) 40 MG tablet Take 40 mg by mouth at bedtime.     ? Calcium Carbonate-Vit D-Min (CALCIUM 1200 PO) Take 1,200 mg by mouth daily.    ?  clotrimazole-betamethasone (LOTRISONE) cream Apply externally BID for 2 wks 45 g 0  ? cyanocobalamin 1000 MCG tablet Take 1,000 mcg by mouth daily.    ? ergocalciferol (VITAMIN D2) 50000 units capsule Take 50,000 Units by mouth once a week.    ? glucose blood test strip Use 4 (four) times daily. Use as instructed.    ? hydrocortisone-pramoxine (PROCTOFOAM HC) rectal foam Place 1 applicator rectally 2 (two) times daily. 10 g 1  ? insulin glargine, 2 Unit Dial, (TOUJEO MAX) 300 UNIT/ML Solostar Pen Inject 68 Units into the skin at bedtime.    ? insulin lispro (HUMALOG) 100 UNIT/ML injection Inject 12-20 Units into the skin 3 (three) times daily before meals.    ? Insulin Pen Needle 31G X 5 MM MISC USE WITH INSULIN    ? losartan (COZAAR) 50 MG tablet Take 50 mg by mouth daily.    ? Melatonin 3 MG TABS Take 6 mg by mouth at bedtime.     ? nystatin cream (MYCOSTATIN) APPLY  TOPICALLY TO THE AFFECTED AREA TWICE DAILY AS NEEDED 30 g 0  ? omeprazole (PRILOSEC) 20 MG capsule Take 20 mg by mouth daily.     ? Polyethyl Glycol-Propyl Glycol (SYSTANE OP) Place 1 drop into both eyes 2 (two) times daily as needed (dry eyes).    ? sulfamethoxazole-trimethoprim (BACTRIM DS) 800-160 MG tablet Take 1 tablet by mouth 2 (two) times daily for 10 days. 20 tablet 0  ? ?No current facility-administered medications for this visit.  ? ? ?Review of Systems  ?Constitutional: Negative for chills, fatigue, fever and unexpected weight change.  ?HENT: Negative for trouble swallowing.  ?Eyes: Negative for loss of vision.  ?Respiratory: Negative for cough, shortness of breath and wheezing.  ?Cardiovascular: Negative for chest pain, leg swelling, palpitations and syncope.  ?GI: Negative for abdominal pain, blood in stool, diarrhea, nausea and vomiting.  ?GU: Negative for difficulty urinating, dysuria, frequency and hematuria.  ?Musculoskeletal: Negative for back pain, leg pain and joint pain.  ?Skin: Negative for rash.  ?Neurological: Negative for dizziness, headaches, light-headedness, numbness and seizures.  ?Psychiatric: Negative for behavioral problem, confusion, depressed mood and sleep disturbance.   ? ?   ?Objective:  ?Objective  ? ?Vitals:  ? 12/10/21 0958  ?BP: 110/70  ?Weight: 248 lb (112.5 kg)  ?Height: '5\' 8"'$  (1.727 m)  ? ?Body mass index is 37.71 kg/m?. ? ?Physical Exam ?Vitals and nursing note reviewed. Exam conducted with a chaperone present.  ?Constitutional:   ?   Appearance: Normal appearance.  ?HENT:  ?   Head: Normocephalic and atraumatic.  ?Eyes:  ?   Extraocular Movements: Extraocular movements intact.  ?   Pupils: Pupils are equal, round, and reactive to light.  ?Cardiovascular:  ?   Rate and Rhythm: Normal rate and regular rhythm.  ?Pulmonary:  ?   Effort: Pulmonary effort is normal.  ?   Breath sounds: Normal breath sounds.  ?Abdominal:  ?   General: Abdomen is flat.  ?   Palpations: Abdomen  is soft.  ?Musculoskeletal:  ?   Cervical back: Normal range of motion.  ?Skin: ?   General: Skin is warm and dry.  ? ?    ?Neurological:  ?   General: No focal deficit present.  ?   Mental Status: She is alert and oriented to person, place, and time.  ?Psychiatric:     ?   Behavior: Behavior normal.     ?   Thought Content: Thought content normal.     ?  Judgment: Judgment normal.  ? ? ?Assessment/Plan:  ?  ? ?66 year old with skin infection ?Skin infection appears to be improving and resolving with exam today.  Recommended that patient continue her antibiotics and continue to clean with Hibiclens soap.  Follow-up as needed. ? ?More than 5 minutes were spent face to face with the patient in the room, reviewing the medical record, labs and images, and coordinating care for the patient. The plan of management was discussed in detail and counseling was provided.  ? ? ?  ? ?Adrian Prows MD ?[facility], [facility] ?12/10/2021 ?10:07 AM ? ? ?

## 2021-12-16 ENCOUNTER — Ambulatory Visit
Admission: RE | Admit: 2021-12-16 | Discharge: 2021-12-16 | Disposition: A | Discharge status: Home / Self Care | Payer: Medicare Other | Source: Ambulatory Visit | Attending: Obstetrics & Gynecology | Admitting: Obstetrics & Gynecology

## 2021-12-16 DIAGNOSIS — E0590 Thyrotoxicosis, unspecified without thyrotoxic crisis or storm: Secondary | ICD-10-CM | POA: Diagnosis present

## 2021-12-16 DIAGNOSIS — N959 Unspecified menopausal and perimenopausal disorder: Secondary | ICD-10-CM | POA: Insufficient documentation

## 2022-05-31 ENCOUNTER — Telehealth: Payer: Self-pay | Admitting: Obstetrics and Gynecology

## 2022-05-31 ENCOUNTER — Other Ambulatory Visit: Payer: Self-pay | Admitting: Obstetrics and Gynecology

## 2022-05-31 DIAGNOSIS — N898 Other specified noninflammatory disorders of vagina: Secondary | ICD-10-CM

## 2022-05-31 MED ORDER — NYSTATIN 100000 UNIT/GM EX CREA: TOPICAL_CREAM | CUTANEOUS | 0 refills | Status: DC

## 2022-05-31 NOTE — Telephone Encounter (Signed)
Pt is scheduled with you for her annual on Oct. 31st.  She is needing to have her Niastatin (spelling?) refilled.  Can she get a refill on this RX since she is scheduled?

## 2022-05-31 NOTE — Telephone Encounter (Signed)
Rx RF eRxd.  

## 2022-05-31 NOTE — Progress Notes (Signed)
Rx RF nystatin till 10/23 annual

## 2022-06-01 NOTE — Telephone Encounter (Signed)
Pt aware.

## 2022-07-12 ENCOUNTER — Ambulatory Visit: Payer: Medicare Other | Admitting: Obstetrics and Gynecology

## 2022-07-18 ENCOUNTER — Other Ambulatory Visit: Payer: Self-pay | Admitting: General Surgery

## 2022-07-18 DIAGNOSIS — Z1231 Encounter for screening mammogram for malignant neoplasm of breast: Secondary | ICD-10-CM

## 2022-07-18 DIAGNOSIS — N6099 Unspecified benign mammary dysplasia of unspecified breast: Secondary | ICD-10-CM

## 2022-08-15 NOTE — Progress Notes (Unsigned)
PCP: Kirk Ruths, MD   No chief complaint on file.   HPI:      Terry Hickman is a 66 y.o. G1P0010 whose LMP was No LMP recorded. Patient has had a hysterectomy., presents today for her MEDICARE annual examination.  Her menses are absent due to hyst. She {does:18564} have vasomotor sx.   Sex activity: {sex active: 315163}. She {does:18564} have vaginal dryness.  Last Pap: 04/06/21 Results were: no abnormalities Hx of STDs: {STD hx:14358}  Last mammogram: 08/26/21  Results were: normal--routine follow-up in 12 months. Has appt 12/23 There is no FH of breast cancer. There is no FH of ovarian cancer. The patient {does:18564} do self-breast exams.  Colonoscopy: 10/17 and 12/22;  Repeat due after 10*** years.  DEXA: 4/23 at [facility] normal spine, osteopenia in fem neck Tobacco use: {tob:20664} Alcohol use: {Alcohol:11675} No drug use Exercise: {exercise:31265}  She {does:18564} get adequate calcium and Vitamin D in her diet.  Labs with PCP.   Patient Active Problem List   Diagnosis Date Noted   Urge incontinence of urine 04/06/2021   Vitamin D deficiency disease 10/18/2018   Lymphedema 11/29/2017   Health care maintenance 11/18/2015   Essential hypertension 11/17/2014   Morning headache 02/01/2013   Severe obesity (BMI 35.0-35.9 with comorbidity) ([place]) 02/01/2013   Snoring 02/01/2013   Sleep apnea 12/06/2012   Back pain 06/28/2012   Gall stones 04/23/2012   High risk medication use 12/22/2011   History of Graves' disease 12/22/2011   History of iron deficiency 12/22/2011   History of non anemic vitamin B12 deficiency 12/22/2011   Diabetes mellitus type 1, uncontrolled 07/13/2011   GERD (gastroesophageal reflux disease) 07/13/2011   Hypercholesterolemia 07/13/2011    Past Surgical History:  Procedure Laterality Date   ABDOMINAL HYSTERECTOMY     BREAST BIOPSY Right 09/13/2018   Papilloma, [place] bx   BREAST BIOPSY Right 09/13/2018   Papilloma, [place] bx    BREAST BIOPSY Right 09/27/2018   affirm bx of mass,  x clip, benign   BREAST BIOPSY Right 11/19/2018   Procedure: EXCISION RIGHT BREAST PAPILLOMA WITH NEEDLE LOCALIZATION X2 10:30 START PER MAMMO;  Surgeon: Robert Bellow, MD;  Location: [facility] ORS;  Service: General;  Laterality: Right;   BREAST EXCISIONAL BIOPSY Right 09/12/1990   negative   BREAST LUMPECTOMY Right 11/19/2018   papilloma   COLONOSCOPY N/A 08/16/2021   Procedure: COLONOSCOPY;  Surgeon: Lesly Rubenstein, MD;  Location: [facility] ENDOSCOPY;  Service: Endoscopy;  Laterality: N/A;  IDDM   COLONOSCOPY WITH PROPOFOL N/A 07/06/2016   Procedure: COLONOSCOPY WITH PROPOFOL;  Surgeon: Manya Silvas, MD;  Location: [facility] END[facility]COPY;  Service: Endoscopy;  Laterality: N/A;   ECTOPIC PREGNANCY SURGERY     HAND SURGERY     MANDIBLE SURGERY     WISDOM TOOTH EXTRACTION      Family History  Problem Relation Age of Onset   Bladder Cancer Father    Prostate cancer Father    Bone cancer Maternal Aunt 32   Cancer Maternal Aunt    Diabetes Mother    Hyperthyroidism Mother    Cancer Maternal Grandmother    Breast cancer Neg Hx     Social History   Socioeconomic History   Marital status: Married    Spouse name: Not on file   Number of children: Not on file   Years of education: Not on file   Highest education level: Not on file  Occupational History   Not on file  Tobacco Use   Smoking status: Never   Smokeless tobacco: Never  Vaping Use   Vaping Use: Never used  Substance and Sexual Activity   Alcohol use: No   Drug use: No   Sexual activity: Yes  Other Topics Concern   Not on file  Social History Narrative   Not on file   Social Determinants of Health   Financial Resource Strain: Not on file  Food Insecurity: Not on file  Transportation Needs: Not on file  Physical Activity: Not on file  Stress: Not on file  Social Connections: Not on file  Intimate Partner Violence: Not on file     Current Outpatient  Medications:    Ascorbic Acid (VITAMIN C) 1000 MG tablet, Take 1,000 mg by mouth daily. , Disp: , Rfl:    ASPIRIN 81 PO, Take 81 mg by mouth daily. , Disp: , Rfl:    atorvastatin (LIPITOR) 40 MG tablet, Take 40 mg by mouth at bedtime. , Disp: , Rfl:    Calcium Carbonate-Vit D-Min (CALCIUM 1200 PO), Take 1,200 mg by mouth daily., Disp: , Rfl:    clotrimazole-betamethasone (LOTRISONE) cream, Apply externally BID for 2 wks, Disp: 45 g, Rfl: 0   cyanocobalamin 1000 MCG tablet, Take 1,000 mcg by mouth daily., Disp: , Rfl:    ergocalciferol (VITAMIN D2) 50000 units capsule, Take 50,000 Units by mouth once a week., Disp: , Rfl:    glucose blood test strip, Use 4 (four) times daily. Use as instructed., Disp: , Rfl:    hydrocortisone-pramoxine (PROCTOFOAM HC) rectal foam, Place 1 applicator rectally 2 (two) times daily., Disp: 10 g, Rfl: 1   insulin glargine, 2 Unit Dial, (TOUJEO MAX) 300 UNIT/ML Solostar Pen, Inject 68 Units into the skin at bedtime., Disp: , Rfl:    insulin lispro (HUMALOG) 100 UNIT/ML injection, Inject 12-20 Units into the skin 3 (three) times daily before meals., Disp: , Rfl:    Insulin Pen Needle 31G X 5 MM MISC, USE WITH INSULIN, Disp: , Rfl:    losartan (COZAAR) 50 MG tablet, Take 50 mg by mouth daily., Disp: , Rfl:    Melatonin 3 MG TABS, Take 6 mg by mouth at bedtime. , Disp: , Rfl:    nystatin cream (MYCOSTATIN), APPLY TOPICALLY TO THE AFFECTED AREA TWICE DAILY AS NEEDED, Disp: 30 g, Rfl: 0   omeprazole (PRILOSEC) 20 MG capsule, Take 20 mg by mouth daily. , Disp: , Rfl:    Polyethyl Glycol-Propyl Glycol (SYSTANE OP), Place 1 drop into both eyes 2 (two) times daily as needed (dry eyes)., Disp: , Rfl:      ROS:  Review of Systems BREAST: No symptoms    Objective: There were no vitals taken for this visit.   OBGyn Exam  Results: No results found for this or any previous visit (from the past 24 hour(s)).  Assessment/Plan:  No diagnosis found.   No orders of  the defined types were placed in this encounter.           GYN counsel {counseling: 16159}    F/U  No follow-ups on file.  Kelbie Moro B. Talina Pleitez, PA-C 08/15/2022 2:47 PM

## 2022-08-16 ENCOUNTER — Encounter: Payer: Self-pay | Admitting: Obstetrics and Gynecology

## 2022-08-16 ENCOUNTER — Ambulatory Visit (INDEPENDENT_AMBULATORY_CARE_PROVIDER_SITE_OTHER): Payer: Medicare Other | Admitting: Obstetrics and Gynecology

## 2022-08-16 VITALS — BP 134/70 | Ht 68.0 in | Wt 251.0 lb

## 2022-08-16 DIAGNOSIS — K640 First degree hemorrhoids: Secondary | ICD-10-CM

## 2022-08-16 DIAGNOSIS — Z01411 Encounter for gynecological examination (general) (routine) with abnormal findings: Secondary | ICD-10-CM

## 2022-08-16 DIAGNOSIS — N898 Other specified noninflammatory disorders of vagina: Secondary | ICD-10-CM

## 2022-08-16 DIAGNOSIS — Z1231 Encounter for screening mammogram for malignant neoplasm of breast: Secondary | ICD-10-CM

## 2022-08-16 DIAGNOSIS — Z1211 Encounter for screening for malignant neoplasm of colon: Secondary | ICD-10-CM

## 2022-08-16 DIAGNOSIS — Z01419 Encounter for gynecological examination (general) (routine) without abnormal findings: Secondary | ICD-10-CM

## 2022-08-16 DIAGNOSIS — B354 Tinea corporis: Secondary | ICD-10-CM

## 2022-08-16 MED ORDER — CLOTRIMAZOLE-BETAMETHASONE 1-0.05 % EX CREA: TOPICAL_CREAM | CUTANEOUS | 1 refills | Status: DC

## 2022-08-16 MED ORDER — NYSTATIN 100000 UNIT/GM EX CREA: TOPICAL_CREAM | CUTANEOUS | 1 refills | Status: DC

## 2022-08-16 MED ORDER — PROCTOFOAM HC 1-1 % EX FOAM: 1.0000 | Freq: Two times a day (BID) | CUTANEOUS | 2 refills | Status: DC

## 2022-08-16 NOTE — Patient Instructions (Signed)
I value your feedback and you entrusting us with your care. If you get a [facility] patient survey, I would appreciate you taking the time to let us know about your experience today. Thank you! ? ? ?

## 2022-08-29 ENCOUNTER — Ambulatory Visit
Admission: RE | Admit: 2022-08-29 | Discharge: 2022-08-29 | Disposition: A | Discharge status: Home / Self Care | Payer: Medicare Other | Source: Ambulatory Visit | Attending: General Surgery | Admitting: General Surgery

## 2022-08-29 DIAGNOSIS — Z1231 Encounter for screening mammogram for malignant neoplasm of breast: Secondary | ICD-10-CM | POA: Insufficient documentation

## 2023-02-10 ENCOUNTER — Other Ambulatory Visit: Payer: Self-pay | Admitting: Sports Medicine

## 2023-02-10 DIAGNOSIS — M7551 Bursitis of right shoulder: Secondary | ICD-10-CM

## 2023-02-10 DIAGNOSIS — W19XXXD Unspecified fall, subsequent encounter: Secondary | ICD-10-CM

## 2023-02-10 DIAGNOSIS — M7591 Shoulder lesion, unspecified, right shoulder: Secondary | ICD-10-CM

## 2023-02-10 DIAGNOSIS — M778 Other enthesopathies, not elsewhere classified: Secondary | ICD-10-CM

## 2023-02-10 DIAGNOSIS — M25511 Pain in right shoulder: Secondary | ICD-10-CM

## 2023-02-13 ENCOUNTER — Ambulatory Visit
Admission: RE | Admit: 2023-02-13 | Discharge: 2023-02-13 | Disposition: A | Discharge status: Home / Self Care | Payer: Medicare Other | Source: Ambulatory Visit | Attending: Sports Medicine | Admitting: Sports Medicine

## 2023-02-13 DIAGNOSIS — S42141A Displaced fracture of glenoid cavity of scapula, right shoulder, initial encounter for closed fracture: Secondary | ICD-10-CM | POA: Insufficient documentation

## 2023-02-13 DIAGNOSIS — S43431A Superior glenoid labrum lesion of right shoulder, initial encounter: Secondary | ICD-10-CM | POA: Diagnosis not present

## 2023-02-13 DIAGNOSIS — M67813 Other specified disorders of tendon, right shoulder: Secondary | ICD-10-CM | POA: Insufficient documentation

## 2023-02-13 DIAGNOSIS — M778 Other enthesopathies, not elsewhere classified: Secondary | ICD-10-CM

## 2023-02-13 DIAGNOSIS — M7551 Bursitis of right shoulder: Secondary | ICD-10-CM | POA: Insufficient documentation

## 2023-02-13 DIAGNOSIS — W19XXXA Unspecified fall, initial encounter: Secondary | ICD-10-CM | POA: Insufficient documentation

## 2023-02-13 DIAGNOSIS — M25511 Pain in right shoulder: Secondary | ICD-10-CM

## 2023-02-13 DIAGNOSIS — M7581 Other shoulder lesions, right shoulder: Secondary | ICD-10-CM | POA: Diagnosis not present

## 2023-02-13 DIAGNOSIS — W19XXXD Unspecified fall, subsequent encounter: Secondary | ICD-10-CM

## 2023-02-17 ENCOUNTER — Other Ambulatory Visit: Payer: Self-pay | Admitting: Sports Medicine

## 2023-02-17 DIAGNOSIS — M25511 Pain in right shoulder: Secondary | ICD-10-CM

## 2023-02-17 DIAGNOSIS — W19XXXD Unspecified fall, subsequent encounter: Secondary | ICD-10-CM

## 2023-02-20 ENCOUNTER — Ambulatory Visit
Admission: RE | Admit: 2023-02-20 | Discharge: 2023-02-20 | Disposition: A | Discharge status: Home / Self Care | Payer: Medicare Other | Source: Ambulatory Visit | Attending: Sports Medicine | Admitting: Sports Medicine

## 2023-02-20 DIAGNOSIS — M25511 Pain in right shoulder: Secondary | ICD-10-CM | POA: Diagnosis present

## 2023-02-20 DIAGNOSIS — W19XXXD Unspecified fall, subsequent encounter: Secondary | ICD-10-CM | POA: Diagnosis not present

## 2023-03-16 NOTE — Progress Notes (Signed)
Terry Regulus, MD   Chief Complaint  Patient presents with   Vaginal Exam    External lump in groin area with on/off bleeding x couple of months    HPI:      Terry Hickman is a 67 y.o. G1P0010 whose LMP was No LMP recorded. Patient has had a hysterectomy., presents today for painful area/lesion in mons that started about 2 months ago. Looked like it was improving then recurred. Worse for about 2 wks. Bleeds a little occas, no purulent d/c. Uses dove sens skin soap, no dryer sheets. No meds to treat no warm compresses. Gets lesions occas in vulva/groin but don't leave scars. 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   Also complains of urge incontinence, OAB/urinary frequency with good flow. Hx of type 1 DM, not well controlled currently (HgB=9.8 6/24) but has always had frequency. Voids almost hourly; drinks 2 caffeinated drinks in AM. Water rest of day. Terry Hickman has SUI if bladder is full.   08/16/22 NOTE: Needs Rx RF on nystatin for vaginal itching occas, clotrimazole for tinea cruris on hyst scar/panus area, and proctofoam for occas hemorrhoid flares. Hx of Type 1 DM. Has occas pimples that develop on mons and abd. Leave hyperpigmented area. Doesn't pop them due to hx of MRSA with popped lesion in past.   Patient Active Problem List   Diagnosis Date Noted   Urge incontinence of urine 04/06/2021   Vitamin D deficiency disease 10/18/2018   Lymphedema 11/29/2017   Health care maintenance 11/18/2015   Essential hypertension 11/17/2014   Morning headache 02/01/2013   Severe obesity (BMI 35.0-35.9 with comorbidity) ([facility]) 02/01/2013   Snoring 02/01/2013   Sleep apnea 12/06/2012   Back pain 06/28/2012   Gall stones 04/23/2012   High risk medication use 12/22/2011   History of Graves' disease 12/22/2011   History of iron deficiency 12/22/2011   History of non anemic vitamin B12 deficiency 12/22/2011   Diabetes mellitus type 1, uncontrolled 07/13/2011   GERD (gastroesophageal reflux  disease) 07/13/2011   Hypercholesterolemia 07/13/2011    Past Surgical History:  Procedure Laterality Date   ABDOMINAL HYSTERECTOMY     BREAST BIOPSY Right 09/13/2018   Papilloma, [place] bx   BREAST BIOPSY Right 09/13/2018   Papilloma, [place] bx   BREAST BIOPSY Right 09/27/2018   affirm bx of mass,  x clip, benign   BREAST BIOPSY Right 11/19/2018   Procedure: EXCISION RIGHT BREAST PAPILLOMA WITH NEEDLE LOCALIZATION X2 10:30 START PER MAMMO;  Surgeon: Terry Mayotte, MD;  Location: [facility] ORS;  Service: General;  Laterality: Right;   BREAST EXCISIONAL BIOPSY Right 09/12/1990   negative   BREAST LUMPECTOMY Right 11/19/2018   papilloma   COLONOSCOPY N/A 08/16/2021   Procedure: COLONOSCOPY;  Surgeon: Terry Bill, MD;  Location: [facility] ENDOSCOPY;  Service: Endoscopy;  Laterality: N/A;  IDDM   COLONOSCOPY WITH PROPOFOL N/A 07/06/2016   Procedure: COLONOSCOPY WITH PROPOFOL;  Surgeon: Terry Jun, MD;  Location: [facility] ENDOSCOPY;  Service: Endoscopy;  Laterality: N/A;   ECTOPIC PREGNANCY SURGERY     HAND SURGERY     MANDIBLE SURGERY     WISDOM TOOTH EXTRACTION      Family History  Problem Relation Age of Onset   Diabetes Mother    Hyperthyroidism Mother    Bladder Cancer Father    Prostate cancer Father    Bone cancer Maternal Aunt 86   Cancer Maternal Grandmother        unknown  cancer type   Breast cancer Neg Hx     Social History   Socioeconomic History   Marital status: Married    Spouse name: Not on file   Number of children: Not on file   Years of education: Not on file   Highest education level: Not on file  Occupational History   Not on file  Tobacco Use   Smoking status: Never   Smokeless tobacco: Never  Vaping Use   Vaping Use: Never used  Substance and Sexual Activity   Alcohol use: No   Drug use: No   Sexual activity: Yes  Other Topics Concern   Not on file  Social History Narrative   Not on file   Social Determinants of Health   Financial  Resource Strain: Not on file  Food Insecurity: Not on file  Transportation Needs: Not on file  Physical Activity: Not on file  Stress: Not on file  Social Connections: Not on file  Intimate Partner Violence: Not on file    Outpatient Medications Prior to Visit  Medication Sig Dispense Refill   Accu-Chek FastClix Lancets MISC See admin instructions.     Ascorbic Acid (VITAMIN C) 1000 MG tablet Take 1,000 mg by mouth daily.      ASPIRIN 81 PO Take 81 mg by mouth daily.      atorvastatin (LIPITOR) 40 MG tablet Take 40 mg by mouth at bedtime.      BAQSIMI ONE PACK 3 MG/DOSE POWD Place 1 spray into both nostrils as directed.     Continuous Blood Gluc Receiver (DEXCOM G6 RECEIVER) DEVI See admin instructions.     Continuous Blood Gluc Sensor (DEXCOM G6 SENSOR) MISC Use 1 each every 10 (ten) days for 360 days     Continuous Blood Gluc Transmit (DEXCOM G6 TRANSMITTER) MISC 1 Device every 3 (three) months for 360 days     cyanocobalamin 1000 MCG tablet Take 1,000 mcg by mouth daily.     Cysteamine Bitartrate (PROCYSBI) 300 MG PACK Use 1 each 4 (four) times daily Use as instructed.     ergocalciferol (VITAMIN D2) 50000 units capsule Take 50,000 Units by mouth once a week.     glucose blood test strip Use 4 (four) times daily. Use as instructed.     hydrocortisone-pramoxine (PROCTOFOAM HC) rectal foam Place 1 applicator rectally 2 (two) times daily. 10 g 2   insulin glargine, 2 Unit Dial, (TOUJEO MAX) 300 UNIT/ML Solostar Pen Inject 68 Units into the skin at bedtime.     insulin lispro (HUMALOG) 100 UNIT/ML injection Inject 12-20 Units into the skin 3 (three) times daily before meals.     Insulin Pen Needle 31G X 5 MM MISC USE WITH INSULIN     losartan (COZAAR) 50 MG tablet Take 50 mg by mouth daily.     Melatonin 3 MG TABS Take 6 mg by mouth at bedtime.      metFORMIN (GLUCOPHAGE-XR) 500 MG 24 hr tablet Take by mouth.     metoprolol succinate (TOPROL-XL) 25 MG 24 hr tablet      nystatin cream  (MYCOSTATIN) APPLY TOPICALLY TO THE AFFECTED AREA TWICE DAILY AS NEEDED 30 g 1   omeprazole (PRILOSEC) 20 MG capsule Take 20 mg by mouth daily.      Polyethyl Glycol-Propyl Glycol (SYSTANE OP) Place 1 drop into both eyes 2 (two) times daily as needed (dry eyes).     rosuvastatin (CRESTOR) 40 MG tablet Take 40 mg by mouth daily.  TRULICITY 0.75 MG/0.5ML SOPN Inject into the skin.     clotrimazole-betamethasone (LOTRISONE) cream Apply externally BID for 2 wks 45 g 1   spironolactone (ALDACTONE) 50 MG tablet Take by mouth.     triamcinolone ointment (KENALOG) 0.1 % as needed.     Calcium Carbonate-Vit D-Min (CALCIUM 1200 PO) Take 1,200 mg by mouth daily.     No facility-administered medications prior to visit.      ROS:  Review of Systems  Constitutional:  Negative for fever.  Gastrointestinal:  Negative for blood in stool, constipation, diarrhea, nausea and vomiting.  Genitourinary:  Positive for frequency. Negative for dyspareunia, dysuria, flank pain, hematuria, urgency, vaginal bleeding, vaginal discharge and vaginal pain.  Musculoskeletal:  Negative for back pain.  Skin:  Negative for rash.   BREAST: No symptoms   OBJECTIVE:   Vitals:  BP 118/82   Ht 5\' 8"  (1.727 m)   Wt 254 lb (115.2 kg)   BMI 38.62 kg/m   Physical Exam Vitals reviewed.  Constitutional:      Appearance: She is well-developed.  Pulmonary:     Effort: Pulmonary effort is normal.  Genitourinary:    Labia:        Right: No tenderness or lesion.        Left: No tenderness or lesion.     Musculoskeletal:        General: Normal range of motion.     Cervical back: Normal range of motion.  Skin:    General: Skin is warm and dry.  Neurological:     General: No focal deficit present.     Mental Status: She is alert and oriented to person, place, and time.     Cranial Nerves: No cranial nerve deficit.  Psychiatric:        Mood and Affect: Mood normal.        Behavior: Behavior normal.         Thought Content: Thought content normal.        Judgment: Judgment normal.     Assessment/Plan: Skin infection - Plan: doxycycline (VIBRAMYCIN) 100 MG capsule; 2 lesions, 1 resolved; 1 improving. Given chronicity, will treat with Rx doxy/warm compresses. Reassurance. Question very mild HS vs prone to skin abscess. Wash mons with dial antibacterial soap. F/u prn.   Mixed stress and urge urinary incontinence - Plan: tolterodine (DETROL LA) 4 MG 24 hr capsule; Try detrol. F/u via MyChart/phone msg in 3 months/sooner prn. Decrease caffeine; sx should also improve with improved glucose levels    Meds ordered this encounter  Medications   doxycycline (VIBRAMYCIN) 100 MG capsule    Sig: Take 1 capsule (100 mg total) by mouth 2 (two) times daily.    Dispense:  14 capsule    Refill:  0    Order Specific Question:   Supervising Provider    Answer:   Hildred Laser [[id number]]   tolterodine (DETROL LA) 4 MG 24 hr capsule    Sig: Take 1 capsule (4 mg total) by mouth daily.    Dispense:  90 capsule    Refill:  0    Order Specific Question:   Supervising Provider    Answer:   Hildred Laser [[id number]]      No follow-ups on file.  Mylea Roarty B. Kassie Keng, PA-C 03/17/2023 1:29 PM

## 2023-03-17 ENCOUNTER — Ambulatory Visit (INDEPENDENT_AMBULATORY_CARE_PROVIDER_SITE_OTHER): Payer: Medicare Other | Admitting: Obstetrics and Gynecology

## 2023-03-17 ENCOUNTER — Encounter: Payer: Self-pay | Admitting: Obstetrics and Gynecology

## 2023-03-17 VITALS — BP 118/82 | Ht 68.0 in | Wt 254.0 lb

## 2023-03-17 DIAGNOSIS — L089 Local infection of the skin and subcutaneous tissue, unspecified: Secondary | ICD-10-CM | POA: Diagnosis not present

## 2023-03-17 DIAGNOSIS — N3946 Mixed incontinence: Secondary | ICD-10-CM

## 2023-03-17 MED ORDER — TOLTERODINE TARTRATE ER 4 MG PO CP24: 4.0000 mg | ORAL_CAPSULE | Freq: Every day | ORAL | 0 refills | Status: DC

## 2023-03-17 MED ORDER — DOXYCYCLINE HYCLATE 100 MG PO CAPS: 100.0000 mg | ORAL_CAPSULE | Freq: Two times a day (BID) | ORAL | 0 refills | Status: DC

## 2023-05-01 ENCOUNTER — Other Ambulatory Visit: Payer: Self-pay | Admitting: Sports Medicine

## 2023-05-01 DIAGNOSIS — G8929 Other chronic pain: Secondary | ICD-10-CM

## 2023-05-01 DIAGNOSIS — M7542 Impingement syndrome of left shoulder: Secondary | ICD-10-CM

## 2023-05-01 DIAGNOSIS — M7552 Bursitis of left shoulder: Secondary | ICD-10-CM

## 2023-05-05 ENCOUNTER — Ambulatory Visit
Admission: RE | Admit: 2023-05-05 | Discharge: 2023-05-05 | Disposition: A | Discharge status: Home / Self Care | Payer: Medicare Other | Source: Ambulatory Visit | Attending: Sports Medicine | Admitting: Sports Medicine

## 2023-05-05 DIAGNOSIS — M7552 Bursitis of left shoulder: Secondary | ICD-10-CM | POA: Diagnosis present

## 2023-05-05 DIAGNOSIS — G8929 Other chronic pain: Secondary | ICD-10-CM | POA: Diagnosis present

## 2023-05-05 DIAGNOSIS — M25512 Pain in left shoulder: Secondary | ICD-10-CM | POA: Diagnosis present

## 2023-05-05 DIAGNOSIS — M7542 Impingement syndrome of left shoulder: Secondary | ICD-10-CM | POA: Insufficient documentation

## 2023-08-07 ENCOUNTER — Encounter: Payer: Self-pay | Admitting: Internal Medicine

## 2023-08-07 DIAGNOSIS — Z1231 Encounter for screening mammogram for malignant neoplasm of breast: Secondary | ICD-10-CM

## 2023-09-04 ENCOUNTER — Encounter: Payer: Self-pay | Admitting: Internal Medicine

## 2023-09-04 ENCOUNTER — Other Ambulatory Visit: Payer: Self-pay | Admitting: Obstetrics and Gynecology

## 2023-09-04 DIAGNOSIS — Z1231 Encounter for screening mammogram for malignant neoplasm of breast: Secondary | ICD-10-CM

## 2023-09-19 ENCOUNTER — Ambulatory Visit
Admission: RE | Admit: 2023-09-19 | Discharge: 2023-09-19 | Disposition: A | Discharge status: Home / Self Care | Payer: Medicare Other | Source: Ambulatory Visit | Attending: Obstetrics and Gynecology | Admitting: Obstetrics and Gynecology

## 2023-09-19 DIAGNOSIS — Z1231 Encounter for screening mammogram for malignant neoplasm of breast: Secondary | ICD-10-CM | POA: Insufficient documentation

## 2023-09-20 ENCOUNTER — Other Ambulatory Visit: Payer: Self-pay

## 2023-09-20 DIAGNOSIS — N898 Other specified noninflammatory disorders of vagina: Secondary | ICD-10-CM

## 2023-09-20 DIAGNOSIS — B354 Tinea corporis: Secondary | ICD-10-CM

## 2023-09-20 MED ORDER — NYSTATIN 100000 UNIT/GM EX CREA: TOPICAL_CREAM | CUTANEOUS | 0 refills | Status: DC

## 2023-09-20 MED ORDER — CLOTRIMAZOLE-BETAMETHASONE 1-0.05 % EX CREA: TOPICAL_CREAM | CUTANEOUS | 0 refills | Status: DC

## 2023-09-20 NOTE — Telephone Encounter (Signed)
 Patient calling in requesting refills of 2 medications she is out of prior to her appointment on 09/27/22. One additional refill sent in at this time.

## 2023-09-26 NOTE — Progress Notes (Signed)
PCP: [name], MD   Chief Complaint  Patient presents with   Gynecologic Exam    Discuss mammogram results, redness in pelvic area, pain on left side in pelvic area, incontinence    HPI:      [name] is a 68 y.o. G1P0010 whose LMP was No LMP recorded. Patient has had a hysterectomy., presents today for yearly follow up (annual Q57yrs per her insurance).  Her menses are absent due to [facility] per pt. No PMB, no VS sx.   Sex activity: single partner, contraception - status post hysterectomy. She does not have vaginal dryness.  Last Pap: 04/06/21 Results were: no abnormalities; no hx of abn paps.   Last mammogram: 09/19/23  Results were: normal--routine follow-up in 12 months. Was seeing Dr. Lemar Livings for breast exam due to hx breast bx in past; released by him but told pt she could still be seen. Not seeing anyone now since he retired and would feel better seeing gen surg.   DEXA: 4/23 at [facility] normal spine, osteopenia in fem neck; due for repeat.  Labs with endocrine.  Needs Rx RF on nystatin for vaginal itching occas, clotrimazole for tinea cruris on hyst scar/panus area (tries to keep dry but hasn't tried athlete's foot powder), and proctofoam for occas hemorrhoid flares, rare recently. Hx of Type 1 DM. Has occas pimples that develop on mons and abd. Leave hyperpigmented area. Doesn't pop them due to hx of MRSA with popped lesion in past. Had to do doxy tx 7/24 for 1 lesion, resolved. No recent sx. Doing dial soap now.   Hx of urinary urgency with occas urge incont. Has always voided frequently, but can't hold urine as long anymore. Drinks 2 cups coffee in AM, rest is water. Didn't use to have this issue. Rx detrol LA given to pt at 7/24 appt but she never started it (doesn't remember getting Rx). Would like to try med. Voids at least Q 1 1/2 hrs.   Has been having LLQ pain for over a month. Sx are sharp and then dull, can last a few hrs vs intermittently over a few  days. Can radiate down front of LT leg. Minimal exercise. No aggrav/allev factors. Tries tylenol without relief. No GI sx, no urin sx. No hip arthritis; does have LBP. S/p TAHBSO.    Patient Active Problem List   Diagnosis Date Noted   Osteopenia 09/28/2023   Grade I hemorrhoids 09/28/2023   Mixed stress and urge urinary incontinence 04/06/2021   Vitamin D deficiency disease 10/18/2018   Lymphedema 11/29/2017   Health care maintenance 11/18/2015   Essential hypertension 11/17/2014   Morning headache 02/01/2013   Severe obesity (BMI 35.0-35.9 with comorbidity) (HCC) 02/01/2013   Snoring 02/01/2013   Sleep apnea 12/06/2012   Back pain 06/28/2012   Gall stones 04/23/2012   High risk medication use 12/22/2011   History of Graves' disease 12/22/2011   History of iron deficiency 12/22/2011   History of non anemic vitamin B12 deficiency 12/22/2011   Diabetes mellitus type 1, uncontrolled 07/13/2011   GERD (gastroesophageal reflux disease) 07/13/2011   Hypercholesterolemia 07/13/2011    Past Surgical History:  Procedure Laterality Date   ABDOMINAL HYSTERECTOMY     BREAST BIOPSY Right 09/13/2018   Papilloma, [place] bx   BREAST BIOPSY Right 09/13/2018   Papilloma, [place] bx   BREAST BIOPSY Right 09/27/2018   affirm bx of mass,  x clip, benign   BREAST BIOPSY Right 11/19/2018   Procedure:  EXCISION RIGHT BREAST PAPILLOMA WITH NEEDLE LOCALIZATION X2 10:30 START PER MAMMO;  Surgeon: Earline Mayotte, MD;  Location: [facility] ORS;  Service: General;  Laterality: Right;   BREAST EXCISIONAL BIOPSY Right 09/12/1990   negative   BREAST LUMPECTOMY Right 11/19/2018   papilloma   COLONOSCOPY N/A 08/16/2021   Procedure: COLONOSCOPY;  Surgeon: Regis Bill, MD;  Location: [facility] ENDOSCOPY;  Service: Endoscopy;  Laterality: N/A;  IDDM   COLONOSCOPY WITH PROPOFOL N/A 07/06/2016   Procedure: COLONOSCOPY WITH PROPOFOL;  Surgeon: Scot Jun, MD;  Location: [facility] ENDOSCOPY;  Service: Endoscopy;   Laterality: N/A;   ECTOPIC PREGNANCY SURGERY     HAND SURGERY     MANDIBLE SURGERY     WISDOM TOOTH EXTRACTION      Family History  Problem Relation Age of Onset   Diabetes Mother    Hyperthyroidism Mother    Bladder Cancer Father    Prostate cancer Father    Bone cancer Maternal Aunt 53   Cancer Maternal Grandmother        unknown cancer type   Breast cancer Neg Hx     Social History   Socioeconomic History   Marital status: Married    Spouse name: Not on file   Number of children: Not on file   Years of education: Not on file   Highest education level: Not on file  Occupational History   Not on file  Tobacco Use   Smoking status: Never   Smokeless tobacco: Never  Vaping Use   Vaping status: Never Used  Substance and Sexual Activity   Alcohol use: No   Drug use: No   Sexual activity: Yes  Other Topics Concern   Not on file  Social History Narrative   Not on file   Social Drivers of Health   Financial Resource Strain: Low Risk  (04/11/2022)   Received from [facility], [facility]   Overall Financial Resource Strain (CARDIA)    Difficulty of Paying Living Expenses: Not very hard  Food Insecurity: No Food Insecurity (04/11/2022)   Received from [facility], [facility]   Hunger Vital Sign    Worried About Running Out of Food in the Last Year: Never true    Ran Out of Food in the Last Year: Never true  Transportation Needs: No Transportation Needs (04/11/2022)   Received from [facility], [facility]   [facility] - Transportation    In the past 12 months, has lack of transportation kept you from medical appointments or from getting medications?: No    Lack of Transportation (Non-Medical): No  Physical Activity: Insufficiently Active (06/04/2020)   Received from [facility], [facility]   Exercise Vital Sign    Days of  Exercise per Week: 3 days    Minutes of Exercise per Session: 30 min  Stress: No Stress Concern Present (06/04/2020)   Received from [facility], [facility]   [employer] [facility] - Occupational Stress Questionnaire    Feeling of Stress : Only a little  Social Connections: Not on file  Intimate Partner Violence: Not on file     Current Outpatient Medications:    Accu-Chek FastClix Lancets MISC, See admin instructions., Disp: , Rfl:    Ascorbic Acid (VITAMIN C) 1000 MG tablet, Take 1,000 mg by mouth daily. , Disp: , Rfl:    ASPIRIN 81 PO,  Take 81 mg by mouth daily. , Disp: , Rfl:    BAQSIMI ONE PACK 3 MG/DOSE POWD, Place 1 spray into both nostrils as directed., Disp: , Rfl:    Continuous Blood Gluc Receiver (DEXCOM G6 RECEIVER) DEVI, See admin instructions., Disp: , Rfl:    Continuous Glucose Sensor (DEXCOM G6 SENSOR) MISC, SMARTSIG:1 Each Topical Every 10 Days, Disp: , Rfl:    Continuous Glucose Transmitter (DEXCOM G6 TRANSMITTER) MISC, , Disp: , Rfl:    cyanocobalamin 1000 MCG tablet, Take 1,000 mcg by mouth daily., Disp: , Rfl:    ergocalciferol (VITAMIN D2) 50000 units capsule, Take 50,000 Units by mouth once a week., Disp: , Rfl:    glucose blood test strip, Use 4 (four) times daily. Use as instructed., Disp: , Rfl:    HUMALOG KWIKPEN 200 UNIT/ML KwikPen, Inject into the skin., Disp: , Rfl:    Insulin Pen Needle 31G X 5 MM MISC, USE WITH INSULIN, Disp: , Rfl:    LANTUS SOLOSTAR 100 UNIT/ML Solostar Pen, Inject into the skin., Disp: , Rfl:    losartan (COZAAR) 100 MG tablet, Take 100 mg by mouth daily., Disp: , Rfl:    Melatonin 3 MG TABS, Take 6 mg by mouth at bedtime. , Disp: , Rfl:    omeprazole (PRILOSEC) 20 MG capsule, Take 20 mg by mouth daily. , Disp: , Rfl:    Polyethyl Glycol-Propyl Glycol (SYSTANE OP), Place 1 drop into both eyes 2 (two) times daily as needed (dry eyes)., Disp: , Rfl:    rosuvastatin (CRESTOR) 40 MG  tablet, Take 40 mg by mouth daily., Disp: , Rfl:    triamcinolone ointment (KENALOG) 0.1 %, as needed., Disp: , Rfl:    clotrimazole-betamethasone (LOTRISONE) cream, Apply externally BID for 2 wks, Disp: 45 g, Rfl: 0   hydrocortisone-pramoxine (PROCTOFOAM HC) rectal foam, Place 1 applicator rectally 2 (two) times daily., Disp: 10 g, Rfl: 2   metFORMIN (GLUCOPHAGE-XR) 500 MG 24 hr tablet, Take by mouth., Disp: , Rfl:    nystatin cream (MYCOSTATIN), APPLY TOPICALLY TO THE AFFECTED AREA TWICE DAILY AS NEEDED, Disp: 30 g, Rfl: 1   spironolactone (ALDACTONE) 50 MG tablet, Take by mouth., Disp: , Rfl:    tolterodine (DETROL LA) 4 MG 24 hr capsule, Take 1 capsule (4 mg total) by mouth daily., Disp: 90 capsule, Rfl: 0     ROS:  Review of Systems  Constitutional:  Negative for fatigue, fever and unexpected weight change.  Respiratory:  Negative for cough, shortness of breath and wheezing.   Cardiovascular:  Negative for chest pain, palpitations and leg swelling.  Gastrointestinal:  Negative for blood in stool, constipation, diarrhea, nausea and vomiting.  Endocrine: Negative for cold intolerance, heat intolerance and polyuria.  Genitourinary:  Positive for pelvic pain and urgency. Negative for dyspareunia, dysuria, flank pain, frequency, genital sores, hematuria, menstrual problem, vaginal bleeding, vaginal discharge and vaginal pain.  Musculoskeletal:  Negative for back pain, joint swelling and myalgias.  Skin:  Negative for rash.  Neurological:  Negative for dizziness, syncope, light-headedness, numbness and headaches.  Hematological:  Negative for adenopathy.  Psychiatric/Behavioral:  Negative for agitation, confusion, sleep disturbance and suicidal ideas. The patient is not nervous/anxious.   BREAST: No symptoms   Objective: BP 126/75   Pulse 78   Ht 5\' 8"  (1.727 m)   Wt 255 lb (115.7 kg)   BMI 38.77 kg/m    Physical Exam Constitutional:      Appearance: She is well-developed.   Genitourinary:  Genitourinary Comments: NEG EXT VAG EXAM  Breasts:    Right: No mass, nipple discharge, skin change or tenderness.     Left: No mass, nipple discharge, skin change or tenderness.  Pulmonary:     Effort: Pulmonary effort is normal.  Abdominal:     Palpations: Abdomen is soft. There is no mass.     Tenderness: There is abdominal tenderness in the left lower quadrant. There is no guarding or rebound.     Comments: NEG SKIN EXAM UNDER PANUS TODAY  Musculoskeletal:        General: Normal range of motion.     Cervical back: Normal range of motion.  Neurological:     Mental Status: She is alert and oriented to person, place, and time.  Skin:    General: Skin is warm.  Psychiatric:        Behavior: Behavior normal.        Thought Content: Thought content normal.     Assessment/Plan:  Mixed stress and urge urinary incontinence - Plan: tolterodine (DETROL LA) 4 MG 24 hr capsule; Rx RF detrol. Pt to f/u in 3 months via MYChart re: sx.  LLQ pain - Plan: US PELVIS TRANSVAGINAL NON-OB (TV ONLY), Ambulatory referral to Physical Therapy; tender on exam, most likely [facility] since s/p TAHBSO. Pt would like GYN u/s, will f/u with results. Refer to pelvic PT.   Osteopenia, unspecified location - Plan: DG Bone Density; pt to schedule DEXA, will f/u with results.   Estrogen deficiency - Plan: DG Bone Density  Vaginal itching - Plan: nystatin cream (MYCOSTATIN); Rx RF. Neg exam today.  Tinea corporis - Plan: clotrimazole-betamethasone (LOTRISONE) cream; Rx RF. Neg exam today. Keep dry/try antifungal powder.   Grade I hemorrhoids - Plan: hydrocortisone-pramoxine (PROCTOFOAM HC) rectal foam; Rx RF prn sx.    Meds ordered this encounter  Medications   clotrimazole-betamethasone (LOTRISONE) cream    Sig: Apply externally BID for 2 wks    Dispense:  45 g    Refill:  0   hydrocortisone-pramoxine (PROCTOFOAM HC) rectal foam    Sig: Place 1 applicator rectally 2 (two) times daily.     Dispense:  10 g    Refill:  2   nystatin cream (MYCOSTATIN)    Sig: APPLY TOPICALLY TO THE AFFECTED AREA TWICE DAILY AS NEEDED    Dispense:  30 g    Refill:  1   tolterodine (DETROL LA) 4 MG 24 hr capsule    Sig: Take 1 capsule (4 mg total) by mouth daily.    Dispense:  90 capsule    Refill:  0            GYN counsel breast self exam, mammography screening, adequate intake of calcium and vitamin D, diet and exercise    F/U  Return in about 1 week (around 10/05/2023) for GYN u/s for LLQ pain--[facility] to call wiht results.  Ebrahim Deremer B. Nasteho Glantz, PA-C 09/28/2023 5:06 PM

## 2023-09-28 ENCOUNTER — Ambulatory Visit (INDEPENDENT_AMBULATORY_CARE_PROVIDER_SITE_OTHER): Payer: Medicare Other | Admitting: Obstetrics and Gynecology

## 2023-09-28 ENCOUNTER — Encounter: Payer: Self-pay | Admitting: Obstetrics and Gynecology

## 2023-09-28 VITALS — BP 126/75 | HR 78 | Ht 68.0 in | Wt 255.0 lb

## 2023-09-28 DIAGNOSIS — M8580 Other specified disorders of bone density and structure, unspecified site: Secondary | ICD-10-CM

## 2023-09-28 DIAGNOSIS — E2839 Other primary ovarian failure: Secondary | ICD-10-CM

## 2023-09-28 DIAGNOSIS — Z1231 Encounter for screening mammogram for malignant neoplasm of breast: Secondary | ICD-10-CM

## 2023-09-28 DIAGNOSIS — B354 Tinea corporis: Secondary | ICD-10-CM

## 2023-09-28 DIAGNOSIS — N898 Other specified noninflammatory disorders of vagina: Secondary | ICD-10-CM | POA: Diagnosis not present

## 2023-09-28 DIAGNOSIS — Z01419 Encounter for gynecological examination (general) (routine) without abnormal findings: Secondary | ICD-10-CM

## 2023-09-28 DIAGNOSIS — K640 First degree hemorrhoids: Secondary | ICD-10-CM

## 2023-09-28 DIAGNOSIS — R1032 Left lower quadrant pain: Secondary | ICD-10-CM | POA: Diagnosis not present

## 2023-09-28 DIAGNOSIS — N3946 Mixed incontinence: Secondary | ICD-10-CM | POA: Diagnosis not present

## 2023-09-28 MED ORDER — PROCTOFOAM HC 1-1 % EX FOAM: 1.0000 | Freq: Two times a day (BID) | CUTANEOUS | 2 refills | Status: AC

## 2023-09-28 MED ORDER — NYSTATIN 100000 UNIT/GM EX CREA: TOPICAL_CREAM | CUTANEOUS | 1 refills | Status: AC

## 2023-09-28 MED ORDER — TOLTERODINE TARTRATE ER 4 MG PO CP24: 4.0000 mg | ORAL_CAPSULE | Freq: Every day | ORAL | 0 refills | Status: AC

## 2023-09-28 MED ORDER — CLOTRIMAZOLE-BETAMETHASONE 1-0.05 % EX CREA: TOPICAL_CREAM | CUTANEOUS | 0 refills | Status: DC

## 2023-09-28 NOTE — Patient Instructions (Addendum)
I value your feedback and you entrusting us with your care. If you get a [facility] patient survey, I would appreciate you taking the time to let us know about your experience today. Thank you!  [facility] ([place])--[phone]  

## 2023-10-02 ENCOUNTER — Other Ambulatory Visit: Payer: Medicare Other

## 2023-10-31 ENCOUNTER — Ambulatory Visit: Payer: Medicare Other

## 2023-11-03 ENCOUNTER — Telehealth: Payer: Self-pay | Admitting: Obstetrics and Gynecology

## 2023-11-03 NOTE — Telephone Encounter (Signed)
Pt came for GYN u/s even though s/p TAHBSO. Terry Hickman in u/s confirmed no ovaries and then canceled pt's u/s since not indicated (pt had requested u/s). CMA Gracie to check on pt since I'm out of town. Thx.

## 2023-11-07 NOTE — Telephone Encounter (Signed)
 I placed pelvic PT ref 09/28/23 and pt still hasn't heard from them. Can you pls follow up (they may be behind but close to 6 wks now). Thx.

## 2024-01-07 ENCOUNTER — Other Ambulatory Visit: Payer: Self-pay | Admitting: Obstetrics and Gynecology

## 2024-01-07 DIAGNOSIS — N3946 Mixed incontinence: Secondary | ICD-10-CM

## 2024-03-12 ENCOUNTER — Other Ambulatory Visit (INDEPENDENT_AMBULATORY_CARE_PROVIDER_SITE_OTHER): Payer: Self-pay | Admitting: Nurse Practitioner

## 2024-03-12 DIAGNOSIS — M79674 Pain in right toe(s): Secondary | ICD-10-CM

## 2024-03-18 ENCOUNTER — Encounter (INDEPENDENT_AMBULATORY_CARE_PROVIDER_SITE_OTHER): Payer: Self-pay | Admitting: Nurse Practitioner

## 2024-03-18 ENCOUNTER — Ambulatory Visit (INDEPENDENT_AMBULATORY_CARE_PROVIDER_SITE_OTHER): Admitting: Nurse Practitioner

## 2024-03-18 ENCOUNTER — Ambulatory Visit (INDEPENDENT_AMBULATORY_CARE_PROVIDER_SITE_OTHER)

## 2024-03-18 VITALS — BP 135/84 | HR 76 | Resp 18 | Ht 68.0 in | Wt 251.6 lb

## 2024-03-18 DIAGNOSIS — M79675 Pain in left toe(s): Secondary | ICD-10-CM | POA: Diagnosis not present

## 2024-03-18 DIAGNOSIS — L819 Disorder of pigmentation, unspecified: Secondary | ICD-10-CM

## 2024-03-18 DIAGNOSIS — M542 Cervicalgia: Secondary | ICD-10-CM

## 2024-03-18 DIAGNOSIS — I890 Lymphedema, not elsewhere classified: Secondary | ICD-10-CM

## 2024-03-18 DIAGNOSIS — M79674 Pain in right toe(s): Secondary | ICD-10-CM

## 2024-03-18 DIAGNOSIS — I10 Essential (primary) hypertension: Secondary | ICD-10-CM | POA: Diagnosis not present

## 2024-03-18 NOTE — Progress Notes (Signed)
 Subjective:    Patient ID: Terry Hickman, female    DOB: 14-Dec-1955, 68 y.o.   MRN: [id number] Chief Complaint  Patient presents with   New Patient (Initial Visit)    Ref Layman consult toe pain    The patient is a 68 year old female referred by Dr. Lenon in regards to a wound on her left great toe.  Initially it was discolored but following meeting with dermatology was found that this was more of a fungal issue.  This appearance has improved from where it was previously but she still has some ways to go before it is completely resolved.  However this discoloration because of concern that given her history of type 1 diabetes this could be related to some sort of arterial insufficiency.  She denies classic claudication-like symptoms.  She denies any rest pain.  Today she had an ABI of 0.99 on the right and 0.97 on the left.  She had normal TBI's with good triphasic waveforms and normal toe waveforms bilaterally.  In addition she also struggles with swelling bilaterally.  She notes that the right is worse than her left.  This has been ongoing for years.  She was given Lasix and it was only helpful for a few days.  She has not worn medical grade compression stockings.  She also notes that she does not elevate her legs on a consistent basis.  She is also not engaged in significant activity over the last few years.  Her last concern is of an episode she had several months ago where she began to have what felt like a racing heart with significant neck and jaw pain during that time.  It resolved without any intervention but she has concerned that this could be related to possible carotid disease given her history of diabetes as well.  She denies any amaurosis fugax like symptoms.  There has been no TIA or strokelike symptoms nor has there been any previous reported TIA or stroke.    Review of Systems  Cardiovascular:  Positive for leg swelling.  Musculoskeletal:  Positive for arthralgias.   Skin:  Positive for color change.  All other systems reviewed and are negative.      Objective:   Physical Exam Vitals reviewed.  HENT:     Head: Normocephalic.  Cardiovascular:     Rate and Rhythm: Normal rate.     Pulses:          Dorsalis pedis pulses are 1+ on the right side and 1+ on the left side.  Pulmonary:     Effort: Pulmonary effort is normal.  Musculoskeletal:     Right lower leg: 2+ Edema present.     Left lower leg: 1+ Edema present.  Skin:    General: Skin is warm and dry.  Neurological:     Mental Status: She is alert and oriented to person, place, and time.  Psychiatric:        Mood and Affect: Mood normal.        Behavior: Behavior normal.        Thought Content: Thought content normal.        Judgment: Judgment normal.     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, RLE   Sleep apnea    Vitamin B 12 deficiency    Vitamin D deficiency     Social History   Socioeconomic History   Marital status: Married    Spouse name: Not on file   Number of children: Not on file   Years of education: Not on file   Highest education level: Not on file  Occupational History   Not on file  Tobacco Use   Smoking status: Never   Smokeless tobacco: Never  Vaping Use   Vaping status: Never Used  Substance and Sexual Activity   Alcohol use: No   Drug use: No   Sexual activity: Yes  Other Topics Concern   Not  on file  Social History Narrative   Not on file   Social Drivers of Health   Financial Resource Strain: Low Risk  (02/19/2024)   Received from [facility]   Overall Financial Resource Strain (CARDIA)    Difficulty of Paying Living Expenses: Not very hard  Food Insecurity: No Food Insecurity (02/19/2024)   Received from [facility]   Hunger Vital Sign    Within the past 12 months, you worried that your food would run out before you got the money to buy more.: Never true    Within the past 12 months, the food you bought just didn't last and you didn't have money to get more.: Never true  Transportation Needs: No Transportation Needs (02/19/2024)   Received from [facility] - Transportation    In the past 12 months, has lack of transportation kept you from medical appointments or from getting medications?: No    Lack of Transportation (Non-Medical): No  Physical Activity: Insufficiently Active (06/04/2020)   Received from [facility]   Exercise Vital Sign    On average, how many days per week do you engage in moderate to strenuous exercise (like a brisk walk)?: 3 days    On average, how many minutes do you engage in exercise at this level?: 30 min  Stress: No Stress Concern Present (06/04/2020)   Received from [facility] - Occupational Stress Questionnaire    Feeling of Stress : Only a little  Social Connections: Not on file  Intimate Partner Violence: Not on file    Past Surgical History:  Procedure Laterality Date   ABDOMINAL HYSTERECTOMY     BREAST BIOPSY Right 09/13/2018   Papilloma, us  bx   BREAST BIOPSY Right 09/13/2018   Papilloma, us  bx   BREAST BIOPSY Right 09/27/2018   affirm bx of mass,  x clip, benign   BREAST BIOPSY Right 11/19/2018   Procedure: EXCISION RIGHT BREAST PAPILLOMA WITH NEEDLE LOCALIZATION X2 10:30 START PER MAMMO;   Surgeon: Dessa Reyes ORN, MD;  Location: [facility] ORS;  Service: General;  Laterality: Right;   BREAST EXCISIONAL BIOPSY Right 09/12/1990   negative   BREAST LUMPECTOMY Right 11/19/2018   papilloma  COLONOSCOPY N/A 08/16/2021   Procedure: COLONOSCOPY;  Surgeon: Maryruth Ole DASEN, MD;  Location: [facility] ENDOSCOPY;  Service: Endoscopy;  Laterality: N/A;  IDDM   COLONOSCOPY WITH PROPOFOL  N/A 07/06/2016   Procedure: COLONOSCOPY WITH PROPOFOL ;  Surgeon: Lamar DASEN Holmes, MD;  Location: [facility] ENDOSCOPY;  Service: Endoscopy;  Laterality: N/A;   ECTOPIC PREGNANCY SURGERY     HAND SURGERY     MANDIBLE SURGERY     WISDOM TOOTH EXTRACTION      Family History  Problem Relation Age of Onset   Diabetes Mother    Hyperthyroidism Mother    Bladder Cancer Father    Prostate cancer Father    Bone cancer Maternal Aunt 28   Cancer Maternal Grandmother        unknown cancer type   Breast cancer Neg Hx     Allergies  Allergen Reactions   Codeine Nausea Only       Latest Ref Rng & Units 11/19/2018    7:53 AM 11/05/2018   11:01 AM  CBC  WBC 4.0 - 10.5 K/uL  7.1   Hemoglobin 12.0 - 15.0 g/dL 86.3  86.0   Hematocrit 36.0 - 46.0 % 40.0  42.2   Platelets 150 - 400 K/uL  274       CMP     Component Value Date/Time   NA 139 11/19/2018 0753   K 3.6 11/19/2018 0753   CL 103 11/05/2018 1101   CO2 27 11/05/2018 1101   GLUCOSE 353 (H) 11/19/2018 0753   BUN 11 11/05/2018 1101   CREATININE 0.69 11/05/2018 1101   CALCIUM 8.7 (L) 11/05/2018 1101   GFRNONAA >60 11/05/2018 1101     No results found.     Assessment & Plan:   1. Discoloration of skin of toe (Primary) Today the patient had normal arterial studies.  However given the patient's type 1 diabetes as well as her noted difficulty with her diabetes, I think it is prudent to continue to observe her on an annual basis.  No further intervention required at this time.  2. Essential hypertension Continue antihypertensive medications as  already ordered, these medications have been reviewed and there are no changes at this time.  3. Lymphedema The patient does have a previous diagnosis of lymphedema but it does not appear that she has ever been evaluated for venous insufficiency.  We discussed the different levels of intervention regarding lower extremity swelling largely conservative therapy.  We discussed extensively about medical grade compression stockings, elevation and activity.  The patient will try to engage in these conservative therapy tactics and we will have her return in 3 months.  At that time we will have her undergo venous reflux studies to determine if there is an underlying venous component for her swelling.  Also, pending the conservative therapy, she may be a candidate for lymphedema pump as well.  4. Neck pain, acute The patient notes that she had an episode of significant tachycardia with resulting jaw and neck pain.  She is concerned for possible carotid disease.  While these are atypical symptoms, we can certainly evaluate.  When she returns in 3 months she will also have a carotid duplex.   Current Outpatient Medications on File Prior to Visit  Medication Sig Dispense Refill   Accu-Chek FastClix Lancets MISC See admin instructions.     Ascorbic Acid (VITAMIN C) 1000 MG tablet Take 1,000 mg by mouth daily.      ASPIRIN 81 PO Take 81 mg by mouth  daily.      BAQSIMI ONE PACK 3 MG/DOSE POWD Place 1 spray into both nostrils as directed.     clotrimazole -betamethasone  (LOTRISONE ) cream Apply externally BID for 2 wks 45 g 0   Continuous Blood Gluc Receiver (DEXCOM G6 RECEIVER) DEVI See admin instructions.     Continuous Glucose Sensor (DEXCOM G6 SENSOR) MISC SMARTSIG:1 Each Topical Every 10 Days     Continuous Glucose Transmitter (DEXCOM G6 TRANSMITTER) MISC      cyanocobalamin 1000 MCG tablet Take 1,000 mcg by mouth daily.     ergocalciferol (VITAMIN D2) 50000 units capsule Take 50,000 Units by mouth once a  week.     furosemide (LASIX) 20 MG tablet Take 20 mg by mouth daily.     glucose blood test strip Use 4 (four) times daily. Use as instructed.     HUMALOG KWIKPEN 200 UNIT/ML KwikPen Inject into the skin. (Patient taking differently: Inject 20-24 Units into the skin with breakfast, with lunch, and with evening meal.)     hydrocortisone-pramoxine (PROCTOFOAM  HC) rectal foam Place 1 applicator rectally 2 (two) times daily. 10 g 2   Insulin Pen Needle 31G X 5 MM MISC USE WITH INSULIN     LANTUS SOLOSTAR 100 UNIT/ML Solostar Pen Inject into the skin. (Patient taking differently: Inject 64 Units into the skin at bedtime.)     losartan (COZAAR) 100 MG tablet Take 100 mg by mouth daily.     Melatonin 3 MG TABS Take 6 mg by mouth at bedtime.      metFORMIN (GLUCOPHAGE-XR) 500 MG 24 hr tablet Take by mouth.     nystatin  cream (MYCOSTATIN ) APPLY TOPICALLY TO THE AFFECTED AREA TWICE DAILY AS NEEDED 30 g 1   nystatin  cream (MYCOSTATIN ) Apply 1 Application topically as needed for dry skin.     omeprazole (PRILOSEC) 20 MG capsule Take 20 mg by mouth daily.      Polyethyl Glycol-Propyl Glycol (SYSTANE OP) Place 1 drop into both eyes 2 (two) times daily as needed (dry eyes).     rosuvastatin (CRESTOR) 40 MG tablet Take 40 mg by mouth daily.     spironolactone (ALDACTONE) 50 MG tablet Take by mouth.     triamcinolone  ointment (KENALOG ) 0.1 % as needed.     tolterodine  (DETROL  LA) 4 MG 24 hr capsule Take 1 capsule (4 mg total) by mouth daily. (Patient not taking: Reported on 03/18/2024) 90 capsule 0   No current facility-administered medications on file prior to visit.    There are no Patient Instructions on file for this visit. No follow-ups on file.   Copper Basnett E Shanece Cochrane, NP

## 2024-03-21 LAB — VAS US ABI WITH/WO TBI
Left ABI: 0.97
Right ABI: 0.99

## 2024-06-20 ENCOUNTER — Other Ambulatory Visit (INDEPENDENT_AMBULATORY_CARE_PROVIDER_SITE_OTHER): Payer: Self-pay | Admitting: Nurse Practitioner

## 2024-06-20 DIAGNOSIS — I890 Lymphedema, not elsewhere classified: Secondary | ICD-10-CM

## 2024-06-20 DIAGNOSIS — M542 Cervicalgia: Secondary | ICD-10-CM

## 2024-06-24 ENCOUNTER — Encounter (INDEPENDENT_AMBULATORY_CARE_PROVIDER_SITE_OTHER): Payer: Self-pay | Admitting: Vascular Surgery

## 2024-06-24 ENCOUNTER — Ambulatory Visit (INDEPENDENT_AMBULATORY_CARE_PROVIDER_SITE_OTHER)

## 2024-06-24 ENCOUNTER — Ambulatory Visit (INDEPENDENT_AMBULATORY_CARE_PROVIDER_SITE_OTHER): Admitting: Vascular Surgery

## 2024-06-24 VITALS — BP 138/73 | HR 76 | Resp 18 | Wt 253.8 lb

## 2024-06-24 DIAGNOSIS — Z794 Long term (current) use of insulin: Secondary | ICD-10-CM

## 2024-06-24 DIAGNOSIS — I6529 Occlusion and stenosis of unspecified carotid artery: Secondary | ICD-10-CM

## 2024-06-24 DIAGNOSIS — I890 Lymphedema, not elsewhere classified: Secondary | ICD-10-CM

## 2024-06-24 DIAGNOSIS — E7800 Pure hypercholesterolemia, unspecified: Secondary | ICD-10-CM | POA: Diagnosis not present

## 2024-06-24 DIAGNOSIS — I10 Essential (primary) hypertension: Secondary | ICD-10-CM

## 2024-06-24 DIAGNOSIS — M542 Cervicalgia: Secondary | ICD-10-CM

## 2024-06-24 DIAGNOSIS — E119 Type 2 diabetes mellitus without complications: Secondary | ICD-10-CM

## 2024-07-03 ENCOUNTER — Encounter (INDEPENDENT_AMBULATORY_CARE_PROVIDER_SITE_OTHER): Payer: Self-pay | Admitting: Vascular Surgery

## 2024-07-03 DIAGNOSIS — I6529 Occlusion and stenosis of unspecified carotid artery: Secondary | ICD-10-CM | POA: Insufficient documentation

## 2024-07-03 DIAGNOSIS — E119 Type 2 diabetes mellitus without complications: Secondary | ICD-10-CM | POA: Insufficient documentation

## 2024-07-03 NOTE — Progress Notes (Signed)
 MRN : [id number]  Terry Hickman is a 68 y.o. (1956-05-06) female who presents with chief complaint of legs swell.  History of Present Illness:   The patient returns for follow-up regarding worsening leg pain and swelling.  The swelling has persisted and the pain associated with swelling continues. There have not been any interval development of a ulcerations or wounds.  Since the previous visit the patient has been wearing graduated compression stockings and has noted little if any improvement in the lymphedema. The patient has been using compression routinely morning until night.  The patient also states elevation during the day and exercise is being done too.  The patient is also followed for carotid stenosis. The carotid stenosis followed by ultrasound.   The patient denies amaurosis fugax. There is no recent history of TIA symptoms or focal motor deficits. There is no prior documented CVA.  The patient is taking enteric-coated aspirin 81 mg daily.  No recent shortening of the patient's walking distance or new symptoms consistent with claudication.  No history of rest pain symptoms. No new ulcers or wounds of the lower extremities have occurred.  No documented recent episodes of angina or shortness of breath documented.   Carotid Duplex done today shows 1-39% bilateral ICA stenosis.    Duplex ultrasound of the venous system demonstrates normal deep venous system bilaterally.  No evidence of past thrombus or venous obstruction.  The superficial system appears competent bilaterally with no evidence for significant reflux.  Current Meds  Medication Sig   Accu-Chek FastClix Lancets MISC See admin instructions.   Ascorbic Acid (VITAMIN C) 1000 MG tablet Take 1,000 mg by mouth daily.    ASPIRIN 81 PO Take 81 mg by mouth daily.    BAQSIMI ONE PACK 3 MG/DOSE POWD Place 1 spray into both nostrils as directed.   clotrimazole -betamethasone  (LOTRISONE )  cream Apply externally BID for 2 wks   Continuous Blood Gluc Receiver (DEXCOM G6 RECEIVER) DEVI See admin instructions.   Continuous Glucose Sensor (DEXCOM G6 SENSOR) MISC SMARTSIG:1 Each Topical Every 10 Days   Continuous Glucose Transmitter (DEXCOM G6 TRANSMITTER) MISC    cyanocobalamin 1000 MCG tablet Take 1,000 mcg by mouth daily.   ergocalciferol (VITAMIN D2) 50000 units capsule Take 50,000 Units by mouth once a week.   furosemide (LASIX) 20 MG tablet Take 20 mg by mouth daily.   glucose blood test strip Use 4 (four) times daily. Use as instructed.   HUMALOG KWIKPEN 200 UNIT/ML KwikPen Inject into the skin. (Patient taking differently: Inject 20-24 Units into the skin with breakfast, with lunch, and with evening meal.)   hydrocortisone-pramoxine (PROCTOFOAM  HC) rectal foam Place 1 applicator rectally 2 (two) times daily.   Insulin Pen Needle 31G X 5 MM MISC USE WITH INSULIN   LANTUS SOLOSTAR 100 UNIT/ML Solostar Pen Inject into the skin. (Patient taking differently: Inject 64 Units into the skin at bedtime.)   losartan (COZAAR) 100 MG tablet Take 100 mg by mouth daily.   Melatonin 3 MG TABS Take 6 mg by mouth at bedtime.    metFORMIN (GLUCOPHAGE-XR) 500 MG 24 hr tablet Take by mouth.   nystatin  cream (MYCOSTATIN ) APPLY TOPICALLY TO THE AFFECTED AREA TWICE DAILY AS NEEDED   nystatin  cream (MYCOSTATIN ) Apply 1 Application topically as needed for dry skin.   omeprazole (PRILOSEC) 20 MG  capsule Take 20 mg by mouth daily.    Polyethyl Glycol-Propyl Glycol (SYSTANE OP) Place 1 drop into both eyes 2 (two) times daily as needed (dry eyes).   rosuvastatin (CRESTOR) 40 MG tablet Take 40 mg by mouth daily.   spironolactone (ALDACTONE) 50 MG tablet Take by mouth.   triamcinolone  ointment (KENALOG ) 0.1 % as needed.    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, RLE   Sleep apnea    Vitamin B 12 deficiency    Vitamin D deficiency     Past Surgical History:  Procedure Laterality Date   ABDOMINAL HYSTERECTOMY     BREAST BIOPSY Right 09/13/2018   Papilloma, us  bx   BREAST BIOPSY Right 09/13/2018   Papilloma, us  bx   BREAST BIOPSY Right 09/27/2018   affirm bx of mass,  x clip, benign   BREAST BIOPSY Right 11/19/2018   Procedure: EXCISION RIGHT BREAST PAPILLOMA WITH NEEDLE LOCALIZATION X2 10:30 START PER MAMMO;  Surgeon: Dessa Reyes ORN, MD;  Location: [facility] ORS;  Service: General;  Laterality: Right;   BREAST EXCISIONAL BIOPSY Right 09/12/1990   negative   BREAST LUMPECTOMY Right 11/19/2018   papilloma   COLONOSCOPY N/A 08/16/2021   Procedure: COLONOSCOPY;  Surgeon: Maryruth Ole DASEN, MD;  Location: [facility] ENDOSCOPY;  Service: Endoscopy;  Laterality: N/A;  IDDM   COLONOSCOPY WITH PROPOFOL  N/A 07/06/2016   Procedure: COLONOSCOPY WITH PROPOFOL ;  Surgeon: Lamar DASEN Holmes, MD;  Location: [facility] ENDOSCOPY;  Service: Endoscopy;  Laterality: N/A;   ECTOPIC PREGNANCY SURGERY     HAND SURGERY     MANDIBLE SURGERY     WISDOM TOOTH EXTRACTION      Social History Social History   Tobacco Use   Smoking status: Never   Smokeless tobacco: Never  Vaping Use   Vaping status: Never Used  Substance Use Topics   Alcohol use: No   Drug use: No    Family History Family History  Problem Relation Age of Onset   Diabetes Mother    Hyperthyroidism Mother    Bladder Cancer Father    Prostate cancer Father    Bone cancer Maternal Aunt 41   Cancer Maternal Grandmother        unknown cancer type   Breast cancer Neg Hx     Allergies   Allergen Reactions   Codeine Nausea Only     REVIEW OF SYSTEMS (Negative unless checked)  Constitutional: [] Weight loss  [] Fever  [] Chills Cardiac: [] Chest pain   [] Chest pressure   [] Palpitations   [] Shortness of breath when laying flat   [] Shortness of breath with exertion. Vascular:  [] Pain in legs with walking   [x] Pain in legs with standing  [] History of DVT   [] Phlebitis   [x] Swelling in legs   [] Varicose veins   [] Non-healing ulcers Pulmonary:   [] Uses home oxygen   [] Productive cough   [] Hemoptysis   [] Wheeze  [] COPD   [] Asthma Neurologic:  [] Dizziness   []   Seizures   [] History of stroke   [] History of TIA  [] Aphasia   [] Vissual changes   [] Weakness or numbness in arm   [] Weakness or numbness in leg Musculoskeletal:   [] Joint swelling   [] Joint pain   [] Low back pain Hematologic:  [] Easy bruising  [] Easy bleeding   [] Hypercoagulable state   [] Anemic Gastrointestinal:  [] Diarrhea   [] Vomiting  [] Gastroesophageal reflux/heartburn   [] Difficulty swallowing. Genitourinary:  [] Chronic kidney disease   [] Difficult urination  [] Frequent urination   [] Blood in urine Skin:  [] Rashes   [] Ulcers  Psychological:  [] History of anxiety   []  History of major depression.  Physical Examination  Vitals:   06/24/24 1343  BP: 138/73  Pulse: 76  Resp: 18  Weight: 253 lb 12.8 oz (115.1 kg)   Body mass index is 38.59 kg/m. Gen: WD/WN, NAD Head: [place]/AT, No temporalis wasting.  Ear/Nose/Throat: Hearing grossly intact, nares w/o erythema or drainage, pinna without lesions Eyes: PER, EOMI, sclera nonicteric.  Neck: Supple, no gross masses.  No JVD.  Pulmonary:  Good air movement, no audible wheezing, no use of accessory muscles.  Cardiac: RRR, precordium not hyperdynamic. Vascular:  scattered varicosities present bilaterally.  Mild venous stasis changes to the legs bilaterally.  3-4+ soft pitting edema, CEAP C4sEpAsPr  Vessel Right Left  Radial Palpable Palpable  Gastrointestinal: soft,  non-distended. No guarding/no peritoneal signs.  Musculoskeletal: M/S 5/5 throughout.  No deformity.  Neurologic: CN 2-12 intact. Pain and light touch intact in extremities.  Symmetrical.  Speech is fluent. Motor exam as listed above. Psychiatric: Judgment intact, Mood & affect appropriate for pt's clinical situation. Dermatologic: Venous rashes no ulcers noted.  No changes consistent with cellulitis. Lymph : No lichenification or skin changes of chronic lymphedema.  CBC Lab Results  Component Value Date   WBC 7.1 11/05/2018   HGB 13.6 11/19/2018   HCT 40.0 11/19/2018   MCV 90.0 11/05/2018   PLT 274 11/05/2018    BMET    Component Value Date/Time   NA 139 11/19/2018 0753   K 3.6 11/19/2018 0753   CL 103 11/05/2018 1101   CO2 27 11/05/2018 1101   GLUCOSE 353 (H) 11/19/2018 0753   BUN 11 11/05/2018 1101   CREATININE 0.69 11/05/2018 1101   CALCIUM 8.7 (L) 11/05/2018 1101   GFRNONAA >60 11/05/2018 1101   GFRAA >60 11/05/2018 1101   CrCl cannot be calculated (Patient's most recent lab result is older than the maximum 21 days allowed.).  COAG No results found for: INR, PROTIME  Radiology VAS US  CAROTID Result Date: 06/24/2024 Carotid Arterial Duplex Study Patient Name:  AINHOA RALLO [facility]  Date of Exam:   06/24/2024 Medical Rec #: [id number]            Accession #:    [id number] Date of Birth: 04/17/1956            Patient Gender: F Patient Age:   22 years Exam Location:  [facility] Procedure:      VAS US  CAROTID Referring Phys: ORVIN DARING --------------------------------------------------------------------------------  Performing Technologist: Jerel Croak RVT  Examination Guidelines: A complete evaluation includes B-mode imaging, spectral Doppler, color Doppler, and power Doppler as needed of all accessible portions of each vessel. Bilateral testing is considered an integral part of a complete examination. Limited examinations for reoccurring indications may be  performed as noted.  Right Carotid Findings: +----------+--------+--------+--------+------------------+--------+           PSV cm/sEDV cm/sStenosisPlaque DescriptionComments +----------+--------+--------+--------+------------------+--------+ CCA Prox  94  15                                         +----------+--------+--------+--------+------------------+--------+ CCA Mid   88      12                                         +----------+--------+--------+--------+------------------+--------+ CCA Distal96      21                                         +----------+--------+--------+--------+------------------+--------+ ICA Prox  68      16                                         +----------+--------+--------+--------+------------------+--------+ ICA Mid   59      12                                         +----------+--------+--------+--------+------------------+--------+ ICA Distal58      9                                          +----------+--------+--------+--------+------------------+--------+ ECA       89      9                                          +----------+--------+--------+--------+------------------+--------+ +----------+--------+-------+----------------+-------------------+           PSV cm/sEDV cmsDescribe        Arm Pressure (mmHG) +----------+--------+-------+----------------+-------------------+ Subclavian201            Multiphasic, WNL                    +----------+--------+-------+----------------+-------------------+ +---------+--------+--+--------+--+---------+ VertebralPSV cm/s45EDV cm/s14Antegrade +---------+--------+--+--------+--+---------+  Left Carotid Findings: +----------+--------+--------+--------+------------------+--------+           PSV cm/sEDV cm/sStenosisPlaque DescriptionComments +----------+--------+--------+--------+------------------+--------+ CCA Prox  92      21                                          +----------+--------+--------+--------+------------------+--------+ CCA Mid   83      16                                         +----------+--------+--------+--------+------------------+--------+ CCA Distal79      19                                         +----------+--------+--------+--------+------------------+--------+ ICA Prox  88      15                                         +----------+--------+--------+--------+------------------+--------+  ICA Mid   81      16                                         +----------+--------+--------+--------+------------------+--------+ ICA Distal79      17                                         +----------+--------+--------+--------+------------------+--------+ ECA       89      12                                         +----------+--------+--------+--------+------------------+--------+ +----------+--------+--------+----------------+-------------------+           PSV cm/sEDV cm/sDescribe        Arm Pressure (mmHG) +----------+--------+--------+----------------+-------------------+ [id number]              Multiphasic, WNL                    +----------+--------+--------+----------------+-------------------+ +---------+--------+--+--------+--+---------+ VertebralPSV cm/s41EDV cm/s13Antegrade +---------+--------+--+--------+--+---------+   Summary: Right Carotid: There was no evidence of thrombus, dissection, atherosclerotic                plaque or stenosis in the cervical carotid system. Left Carotid: There is no evidence of stenosis in the left ICA. Vertebrals:  Bilateral vertebral arteries demonstrate antegrade flow. Subclavians: Normal flow hemodynamics were seen in bilateral subclavian              arteries. *See table(s) above for measurements and observations.  Electronically signed by Cordella Shawl MD on 06/24/2024 at 4:53:54 PM.    Final    VAS US  LOWER EXTREMITY VENOUS  REFLUX Result Date: 06/24/2024  Lower Venous Reflux Study Patient Name:  LEWANNA PETRAK [facility]  Date of Exam:   06/24/2024 Medical Rec #: [id number]            Accession #:    [id number] Date of Birth: 1956/04/09            Patient Gender: F Patient Age:   14 years Exam [facility] Procedure:      VAS US  LOWER EXTREMITY VENOUS REFLUX Referring Phys: ORVIN DARING --------------------------------------------------------------------------------  Indications: Swelling, and chronic.  Performing Technologist: Jerel Croak RVT  Examination Guidelines: A complete evaluation includes B-mode imaging, spectral Doppler, color Doppler, and power Doppler as needed of all accessible portions of each vessel. Bilateral testing is considered an integral part of a complete examination. Limited examinations for reoccurring indications may be performed as noted. The reflux portion of the exam is performed with the patient in reverse Trendelenburg. Significant venous reflux is defined as >500 ms in the superficial venous system, and >1 second in the deep venous system.   Summary: Bilateral: - No evidence of deep vein thrombosis seen in the lower extremities, bilaterally, from the common femoral through the popliteal veins. - No evidence of superficial venous thrombosis in the lower extremities, bilaterally. - No evidence of deep venous insufficiency seen bilaterally in the lower extremity. - No evidence of superficial venous reflux seen in the greater saphenous veins bilaterally. - No evidence of superficial venous reflux seen in the short saphenous veins bilaterally.  *See table(s) above for measurements  and observations. Electronically signed by Cordella Shawl MD on 06/24/2024 at 4:53:49 PM.    Final      Assessment/Plan 1. Lymphedema (Primary) Recommend:  No surgery or intervention at this point in time.   The Patient is CEAP C4sEpAsPr.  The patient has been wearing compression for more than 12 weeks  with no or little benefit.  The patient has been exercising daily for more than 12 weeks. The patient has been elevating and taking OTC pain medications for more than 12 weeks.  None of these have have eliminated the pain related to the lymphedema or the discomfort regarding excessive swelling and venous congestion.    I have reviewed my discussion with the patient regarding lymphedema and why it  causes symptoms.  Patient will continue wearing graduated compression on a daily basis. The patient should put the compression on first thing in the morning and removing them in the evening. The patient should not sleep in the compression.   In addition, behavioral modification throughout the day will be continued.  This will include frequent elevation (such as in a recliner), use of over the counter pain medications as needed and exercise such as walking.  The systemic causes for chronic edema such as liver, kidney and cardiac etiologies do not appear to have significant changed over the past year.    The patient has chronic , severe lymphedema with hyperpigmentation of the skin and has done MLD, skin care, medication, diet, exercise, elevation and compression for 4 weeks with no improvement,  I am recommending a lymphedema pump.  The patient still has stage 3 lymphedema and therefore, I believe that a lymph pump is needed to improve the control of the patient's lymphedema and improve the quality of life.  Additionally, a lymph pump is warranted because it will reduce the risk of cellulitis and ulceration in the future.  Patient should follow-up in six months   2. Stenosis of carotid artery, unspecified laterality Recommend:  Given the patient's asymptomatic subcritical stenosis no further invasive testing or surgery at this time.  Duplex ultrasound shows 1-39% stenosis bilaterally.  Continue antiplatelet therapy as prescribed Continue management of CAD, HTN and Hyperlipidemia Healthy heart diet,   encouraged exercise at least 4 times per week  Follow up in 36 months with duplex ultrasound and physical exam   3. Hypercholesterolemia Continue statin as ordered and reviewed, no changes at this time  4. Type 2 diabetes mellitus without complication, with long-term current use of insulin (HCC) Continue hypoglycemic medications as already ordered, these medications have been reviewed and there are no changes at this time.  Hgb A1C to be monitored as already arranged by primary service  5. Essential hypertension Continue antihypertensive medications as already ordered, these medications have been reviewed and there are no changes at this time.    Cordella Shawl, MD  07/03/2024 11:52 AM

## 2024-07-22 ENCOUNTER — Ambulatory Visit: Payer: Self-pay | Admitting: Obstetrics and Gynecology

## 2024-07-22 ENCOUNTER — Ambulatory Visit
Admission: RE | Admit: 2024-07-22 | Discharge: 2024-07-22 | Disposition: A | Discharge status: Home / Self Care | Source: Ambulatory Visit | Attending: Obstetrics and Gynecology | Admitting: Obstetrics and Gynecology

## 2024-07-22 DIAGNOSIS — E2839 Other primary ovarian failure: Secondary | ICD-10-CM | POA: Diagnosis present

## 2024-07-22 DIAGNOSIS — M8580 Other specified disorders of bone density and structure, unspecified site: Secondary | ICD-10-CM | POA: Diagnosis present

## 2024-08-22 ENCOUNTER — Encounter: Payer: Self-pay | Admitting: Obstetrics and Gynecology

## 2024-10-11 ENCOUNTER — Telehealth: Payer: Self-pay

## 2024-10-11 DIAGNOSIS — B354 Tinea corporis: Secondary | ICD-10-CM

## 2024-10-11 DIAGNOSIS — Z1231 Encounter for screening mammogram for malignant neoplasm of breast: Secondary | ICD-10-CM

## 2024-10-11 MED ORDER — NYSTATIN 100000 UNIT/GM EX CREA: 1.0000 | TOPICAL_CREAM | CUTANEOUS | 0 refills | Status: AC | PRN

## 2024-10-11 MED ORDER — CLOTRIMAZOLE-BETAMETHASONE 1-0.05 % EX CREA: TOPICAL_CREAM | CUTANEOUS | 0 refills | Status: AC

## 2024-10-11 NOTE — Telephone Encounter (Signed)
 Her last annual was 08/16/22 so she can be seen now sicne 2 yrs for annual (which is covered for free). She can also just have PCP manage the Rxs and be followed by them. Pls notify pt. Thx.

## 2024-10-11 NOTE — Telephone Encounter (Signed)
 Pt called and stated that she is to only be seen every 2 years because of insurance. Does she still need to be seen or can she go 2 years with out a visit? I refilled her nystatin  cream (MYCOSTATIN )  and clotrimazole -betamethasone  and ordered her mammogram.

## 2024-10-11 NOTE — Telephone Encounter (Signed)
 Pls call pt to put on my schedule for annual. Thx.

## 2024-10-11 NOTE — Telephone Encounter (Signed)
 Ok pt states she will schedule the annual she does not want her PCP to handle female issues.

## 2024-10-30 ENCOUNTER — Ambulatory Visit: Admitting: Obstetrics and Gynecology

## 2024-11-05 ENCOUNTER — Encounter

## 2024-12-23 ENCOUNTER — Ambulatory Visit (INDEPENDENT_AMBULATORY_CARE_PROVIDER_SITE_OTHER): Admitting: Vascular Surgery
